# Patient Record
Sex: Male | Born: 1965 | Race: White | Hispanic: No | Marital: Married | Smoking: Former smoker
Health system: Southern US, Community
[De-identification: ages and names within clinical notes are randomized; demographics above are authoritative.]

## PROBLEM LIST (undated history)

## (undated) DIAGNOSIS — K5792 Diverticulitis of intestine, part unspecified, without perforation or abscess without bleeding: Secondary | ICD-10-CM

## (undated) DIAGNOSIS — E785 Hyperlipidemia, unspecified: Secondary | ICD-10-CM

## (undated) DIAGNOSIS — T7840XA Allergy, unspecified, initial encounter: Secondary | ICD-10-CM

## (undated) HISTORY — DX: Diverticulitis of intestine, part unspecified, without perforation or abscess without bleeding: K57.92

## (undated) HISTORY — DX: Hyperlipidemia, unspecified: E78.5

## (undated) HISTORY — DX: Allergy, unspecified, initial encounter: T78.40XA

## (undated) HISTORY — PX: VASECTOMY: SHX75

---

## 2008-09-09 ENCOUNTER — Encounter (INDEPENDENT_AMBULATORY_CARE_PROVIDER_SITE_OTHER): Payer: Self-pay | Admitting: *Deleted

## 2008-09-30 ENCOUNTER — Ambulatory Visit: Payer: Self-pay | Admitting: Family Medicine

## 2008-09-30 DIAGNOSIS — Z8719 Personal history of other diseases of the digestive system: Secondary | ICD-10-CM | POA: Insufficient documentation

## 2008-09-30 DIAGNOSIS — J309 Allergic rhinitis, unspecified: Secondary | ICD-10-CM | POA: Insufficient documentation

## 2008-09-30 DIAGNOSIS — R5383 Other fatigue: Secondary | ICD-10-CM

## 2008-09-30 DIAGNOSIS — R5381 Other malaise: Secondary | ICD-10-CM | POA: Insufficient documentation

## 2008-09-30 DIAGNOSIS — R079 Chest pain, unspecified: Secondary | ICD-10-CM | POA: Insufficient documentation

## 2008-09-30 DIAGNOSIS — E785 Hyperlipidemia, unspecified: Secondary | ICD-10-CM | POA: Insufficient documentation

## 2008-09-30 LAB — CONVERTED CEMR LAB
AST: 26 units/L (ref 0–37)
Albumin: 4.3 g/dL (ref 3.5–5.2)
Basophils Absolute: 0 10*3/uL (ref 0.0–0.1)
CO2: 27 meq/L (ref 19–32)
Eosinophils Absolute: 0.6 10*3/uL (ref 0.0–0.7)
Glucose, Bld: 105 mg/dL — ABNORMAL HIGH (ref 70–99)
HCT: 44.6 % (ref 39.0–52.0)
Hemoglobin: 15.5 g/dL (ref 13.0–17.0)
Lymphs Abs: 1.8 10*3/uL (ref 0.7–4.0)
MCHC: 34.7 g/dL (ref 30.0–36.0)
Monocytes Relative: 6.5 % (ref 3.0–12.0)
Neutro Abs: 3.4 10*3/uL (ref 1.4–7.7)
Potassium: 4.1 meq/L (ref 3.5–5.1)
RDW: 11.9 % (ref 11.5–14.6)
Sodium: 140 meq/L (ref 135–145)
TSH: 1.87 microintl units/mL (ref 0.35–5.50)
Total Protein: 7.1 g/dL (ref 6.0–8.3)

## 2008-10-14 ENCOUNTER — Ambulatory Visit: Payer: Self-pay | Admitting: Family Medicine

## 2009-02-02 ENCOUNTER — Ambulatory Visit: Payer: Self-pay | Admitting: Family Medicine

## 2009-02-02 LAB — CONVERTED CEMR LAB
Cholesterol: 226 mg/dL — ABNORMAL HIGH (ref 0–200)
Total CHOL/HDL Ratio: 4

## 2009-02-09 ENCOUNTER — Ambulatory Visit: Payer: Self-pay | Admitting: Family Medicine

## 2011-03-21 ENCOUNTER — Encounter: Payer: Self-pay | Admitting: Family Medicine

## 2011-03-22 ENCOUNTER — Encounter: Payer: Self-pay | Admitting: Family Medicine

## 2011-03-22 ENCOUNTER — Ambulatory Visit (INDEPENDENT_AMBULATORY_CARE_PROVIDER_SITE_OTHER)
Admission: RE | Admit: 2011-03-22 | Discharge: 2011-03-22 | Disposition: A | Discharge status: Home / Self Care | Payer: BC Managed Care – PPO | Source: Ambulatory Visit | Attending: Family Medicine | Admitting: Family Medicine

## 2011-03-22 ENCOUNTER — Ambulatory Visit (INDEPENDENT_AMBULATORY_CARE_PROVIDER_SITE_OTHER): Payer: BC Managed Care – PPO | Admitting: Family Medicine

## 2011-03-22 DIAGNOSIS — R29898 Other symptoms and signs involving the musculoskeletal system: Secondary | ICD-10-CM

## 2011-03-22 DIAGNOSIS — R079 Chest pain, unspecified: Secondary | ICD-10-CM

## 2011-03-22 DIAGNOSIS — M948X9 Other specified disorders of cartilage, unspecified sites: Secondary | ICD-10-CM

## 2011-03-22 NOTE — Progress Notes (Signed)
  Subjective:    Patient ID: Kenneth Webb, male    DOB: Dec 04, 1965, 45 y.o.   MRN: [id number]  HPI  Kenneth Webb, a 45 y.o. male presents today in the office for the following:    Had a physical a couple of years ago. Has had some pain in and around the xyphoid process. At night it will throb a little bit.   Will walk and bike In the last two or three months, it will go away afterwards. Will throb.   Two-year history of chest pain, predominantly in the xiphoid process region, where the patient has not had any known direct occult trauma to the area. This is been a persistent problem for him at least 2 years until now. 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 No lower abdominal muscular pain. No pain with exertion or shortness of breath.  The PMH, PSH, Social History, Family History, Medications, and allergies have been reviewed in [facility], and have been updated if relevant.   Review of Systems REVIEW OF SYSTEMS  GEN: No fevers, chills. Nontoxic. Primarily MSK c/o today. MSK: Detailed in the HPI GI: tolerating PO intake without difficulty Neuro: No numbness, parasthesias, or tingling associated. Otherwise the pertinent positives of the ROS are noted above.      Objective:   Physical Exam   Physical Exam  Blood pressure 110/74, pulse 85, temperature 98.7 F (37.1 C), temperature source Oral, height 6' (1.829 m), weight 187 lb 12.8 oz (85.186 kg), SpO2 99.00%.  GEN: WDWN, NAD, Non-toxic, A & O x 3 HEENT: Atraumatic, Normocephalic. Neck supple. No masses, No LAD. Ears and Nose: No external deformity. CV: RRR, No M/G/R. No JVD. No thrill. No extra heart sounds. PULM: CTA B, no wheezes, crackles, rhonchi. No retractions. No resp. distress. No accessory muscle use. Chest wall is notably tender to palpation, at the xiphoid process. Mildly tender to palpation at the lower sternum as well. ABD: S, NT, ND, +BS. No rebound tenderness. No HSM.  EXTR: No c/c/e NEURO Normal  gait.  PSYCH: Normally interactive. Conversant. Not depressed or anxious appearing.  Calm demeanor.        Assessment & Plan:   1. Xiphoid prominence  DG Ribs Bilateral, MR Sternum Wo Contrast, CT Chest Wo Contrast  2. CHEST PAIN  DG Ribs Bilateral, MR Sternum Wo Contrast, CT Chest Wo Contrast    Plain films are negative.  Ended up calling discussing the case with one of the radiologists at [facility], and after discussion, we decided that a plain CT of the chest without contrast would likely be the best way to image this area. It is possible that he could have a stress fracture or have a more unusual injury to this area. Given the length of time, and relatively constant pain, will obtain more advanced imaging

## 2011-03-22 NOTE — Patient Instructions (Signed)
REFERRAL: GO THE THE FRONT ROOM AT THE ENTRANCE OF OUR CLINIC, NEAR CHECK IN. ASK FOR MARION. SHE WILL HELP YOU SET UP YOUR REFERRAL. DATE: TIME:  

## 2011-03-28 ENCOUNTER — Other Ambulatory Visit: Payer: BC Managed Care – PPO

## 2011-03-29 ENCOUNTER — Ambulatory Visit (INDEPENDENT_AMBULATORY_CARE_PROVIDER_SITE_OTHER)
Admission: RE | Admit: 2011-03-29 | Discharge: 2011-03-29 | Disposition: A | Discharge status: Home / Self Care | Payer: BC Managed Care – PPO | Source: Ambulatory Visit | Attending: Family Medicine | Admitting: Family Medicine

## 2011-03-29 DIAGNOSIS — M948X9 Other specified disorders of cartilage, unspecified sites: Secondary | ICD-10-CM

## 2011-03-29 DIAGNOSIS — R29898 Other symptoms and signs involving the musculoskeletal system: Secondary | ICD-10-CM

## 2011-03-29 DIAGNOSIS — R079 Chest pain, unspecified: Secondary | ICD-10-CM

## 2011-03-31 ENCOUNTER — Other Ambulatory Visit: Payer: BC Managed Care – PPO

## 2011-04-13 ENCOUNTER — Other Ambulatory Visit: Payer: Self-pay

## 2011-04-13 ENCOUNTER — Other Ambulatory Visit: Payer: Self-pay | Admitting: Family Medicine

## 2011-04-13 DIAGNOSIS — R5381 Other malaise: Secondary | ICD-10-CM

## 2011-04-13 DIAGNOSIS — E785 Hyperlipidemia, unspecified: Secondary | ICD-10-CM

## 2011-04-13 DIAGNOSIS — Z125 Encounter for screening for malignant neoplasm of prostate: Secondary | ICD-10-CM

## 2011-04-13 DIAGNOSIS — R5383 Other fatigue: Secondary | ICD-10-CM

## 2011-04-20 ENCOUNTER — Encounter: Payer: Self-pay | Admitting: Family Medicine

## 2014-01-06 DIAGNOSIS — Z9852 Vasectomy status: Secondary | ICD-10-CM | POA: Insufficient documentation

## 2015-04-01 ENCOUNTER — Ambulatory Visit (INDEPENDENT_AMBULATORY_CARE_PROVIDER_SITE_OTHER): Payer: BLUE CROSS/BLUE SHIELD | Admitting: Family Medicine

## 2015-04-01 ENCOUNTER — Encounter: Payer: Self-pay | Admitting: Family Medicine

## 2015-04-01 VITALS — BP 106/80 | HR 68 | Temp 98.3°F | Resp 16 | Ht 70.5 in | Wt 182.8 lb

## 2015-04-01 DIAGNOSIS — M542 Cervicalgia: Secondary | ICD-10-CM

## 2015-04-01 DIAGNOSIS — R5383 Other fatigue: Secondary | ICD-10-CM

## 2015-04-01 LAB — CBC WITH DIFFERENTIAL/PLATELET
BASOS PCT: 1 % (ref 0–1)
Basophils Absolute: 0.1 10*3/uL (ref 0.0–0.1)
EOS PCT: 10 % — AB (ref 0–5)
Eosinophils Absolute: 0.5 10*3/uL (ref 0.0–0.7)
HEMATOCRIT: 44 % (ref 39.0–52.0)
HEMOGLOBIN: 15.4 g/dL (ref 13.0–17.0)
Lymphocytes Relative: 35 % (ref 12–46)
Lymphs Abs: 1.8 10*3/uL (ref 0.7–4.0)
MCH: 31.5 pg (ref 26.0–34.0)
MCHC: 35 g/dL (ref 30.0–36.0)
MCV: 90 fL (ref 78.0–100.0)
MONO ABS: 0.4 10*3/uL (ref 0.1–1.0)
MONOS PCT: 8 % (ref 3–12)
MPV: 9.1 fL (ref 8.6–12.4)
NEUTROS ABS: 2.3 10*3/uL (ref 1.7–7.7)
Neutrophils Relative %: 46 % (ref 43–77)
Platelets: 229 10*3/uL (ref 150–400)
RBC: 4.89 MIL/uL (ref 4.22–5.81)
RDW: 13.3 % (ref 11.5–15.5)
WBC: 5.1 10*3/uL (ref 4.0–10.5)

## 2015-04-01 LAB — COMPREHENSIVE METABOLIC PANEL
ALBUMIN: 4.5 g/dL (ref 3.6–5.1)
ALT: 29 U/L (ref 9–46)
AST: 19 U/L (ref 10–40)
Alkaline Phosphatase: 66 U/L (ref 40–115)
BILIRUBIN TOTAL: 0.7 mg/dL (ref 0.2–1.2)
BUN: 11 mg/dL (ref 7–25)
CO2: 26 mmol/L (ref 20–31)
CREATININE: 0.86 mg/dL (ref 0.60–1.35)
Calcium: 9.4 mg/dL (ref 8.6–10.3)
Chloride: 105 mmol/L (ref 98–110)
Glucose, Bld: 90 mg/dL (ref 65–99)
Potassium: 4.5 mmol/L (ref 3.5–5.3)
SODIUM: 139 mmol/L (ref 135–146)
TOTAL PROTEIN: 6.8 g/dL (ref 6.1–8.1)

## 2015-04-01 LAB — TSH: TSH: 1.797 u[IU]/mL (ref 0.350–4.500)

## 2015-04-01 NOTE — Progress Notes (Signed)
   Subjective:    Patient ID: Kenneth Webb, male    DOB: Nov 25, 1965, 49 y.o.   MRN: [id number]  HPI The patient presents today with complaint of anterior left neck pain for more than a month. Pain has gradually gotten worse. Gets worse throughout the day and keeps him awake at night. Does not have pain all the time, but has pain daily. Has pain when he swallows which is relieved when he is finished swallowing. Pain with coughing and sneezing. Took Alleve x 1 yesterday and had some relief. Does not recall any injury to his neck.   Past Medical History  Diagnosis Date  . Allergy   . Hyperlipidemia   . Diverticulitis   No past surgical history on file. Family History  Problem Relation Age of Onset  . Cancer Mother     breast cancer  . Mental illness Mother   . Diabetes Mother   . COPD Father    Social History  Substance Use Topics  . Smoking status: Former Games developer  . Smokeless tobacco: None  . Alcohol Use: Yes    Review of Systems No fever or chills, rare acid reflux, no ear pain, no nasal drainage, no chest pain, no cough, some fatigue related to poor sleep    Objective:   Physical Exam Physical Exam  Constitutional: Oriented to person, place, and time. He appears well-developed and well-nourished.  HENT:  Head: Normocephalic and atraumatic.  Eyes: Conjunctivae are normal.  Ears: Canals and TMs normal Throat: clear,  No erythema or exudate Neck: Normal range of motion. Neck supple. Unable to palpate any enlarged lymph nodes, thyroid normal, not able to reproduce pain.  Cardiovascular: Normal rate, regular rhythm and normal heart sounds.   Pulmonary/Chest: Effort normal and breath sounds normal.  Musculoskeletal: Normal range of motion.  Neurological: Alert and oriented to person, place, and time.  Skin: Skin is warm and dry.  Psychiatric: Normal mood and affect. Behavior is normal. Judgment and thought content normal.  Vitals reviewed.  BP 106/80 mmHg  Pulse 68   Temp(Src) 98.3 F (36.8 C) (Oral)  Resp 16  Ht 5' 10.5" (1.791 m)  Wt 182 lb 12.8 oz (82.918 kg)  BMI 25.85 kg/m2 Wt Readings from Last 3 Encounters:  04/01/15 182 lb 12.8 oz (82.918 kg)  03/22/11 187 lb 12.8 oz (85.186 kg)  02/09/09 181 lb 6.4 oz (82.283 kg)      Assessment & Plan:  Discussed with Dr. Patsy Lager 1. Anterior neck pain - Given duration of symptoms, will obtain CT - Comprehensive metabolic panel - TSH - CBC with Differential/Platelet - CT Soft Tissue Neck W Contrast; Future  2. Other fatigue - Comprehensive metabolic panel - TSH - CBC with Differential/Platelet  - He has an appointment to establish care with Dr. Katrinka Blazing 11/16  Olean Ree, FNP-BC  [facility], [facility]  04/01/2015 1:41 PM

## 2015-04-01 NOTE — Patient Instructions (Signed)
We will call you with CT scan information date/time.

## 2015-04-07 ENCOUNTER — Other Ambulatory Visit: Payer: Self-pay

## 2015-04-08 ENCOUNTER — Encounter: Payer: Self-pay | Admitting: Internal Medicine

## 2015-04-10 ENCOUNTER — Telehealth: Payer: Self-pay | Admitting: Family Medicine

## 2015-04-10 NOTE — Telephone Encounter (Signed)
Noticed that patient's CT of the neck had been cancelled. Called him to see why it was cancelled. Left him a message to let me know if he needs the CT rescheduled or if he is continuing to have symptoms.

## 2015-04-11 NOTE — Telephone Encounter (Signed)
LMOM about labs and to CB about status of CT

## 2015-04-15 NOTE — Telephone Encounter (Signed)
Spoke with pt, he states he did not want to do the test before he knew his lab results so he cancelled it. He states he is doing much better and feels no need to have CT done at this time.

## 2015-05-15 ENCOUNTER — Ambulatory Visit (INDEPENDENT_AMBULATORY_CARE_PROVIDER_SITE_OTHER): Payer: BLUE CROSS/BLUE SHIELD | Admitting: Family Medicine

## 2015-05-15 ENCOUNTER — Encounter: Payer: Self-pay | Admitting: Family Medicine

## 2015-05-15 VITALS — BP 120/78 | HR 56 | Temp 97.9°F | Resp 16 | Ht 70.5 in | Wt 182.0 lb

## 2015-05-15 DIAGNOSIS — Z131 Encounter for screening for diabetes mellitus: Secondary | ICD-10-CM | POA: Diagnosis not present

## 2015-05-15 DIAGNOSIS — Z1329 Encounter for screening for other suspected endocrine disorder: Secondary | ICD-10-CM

## 2015-05-15 DIAGNOSIS — E7800 Pure hypercholesterolemia, unspecified: Secondary | ICD-10-CM

## 2015-05-15 DIAGNOSIS — Z0000 Encounter for general adult medical examination without abnormal findings: Secondary | ICD-10-CM | POA: Diagnosis not present

## 2015-05-15 LAB — POCT URINALYSIS DIP (MANUAL ENTRY)
BILIRUBIN UA: NEGATIVE
BILIRUBIN UA: NEGATIVE
GLUCOSE UA: NEGATIVE
Leukocytes, UA: NEGATIVE
Nitrite, UA: NEGATIVE
Protein Ur, POC: NEGATIVE
SPEC GRAV UA: 1.015
UROBILINOGEN UA: 0.2
pH, UA: 5.5

## 2015-05-15 NOTE — Progress Notes (Signed)
Subjective:    Patient ID: [name], male    DOB: [birth year], 49 y.o.   MRN: [id number]  05/15/2015  Establish Care   HPI This 49 y.o. male presents for Complete Physical Examination and to establish care.  Last physical:  2010 Colonoscopy: never TDAP:  2010 Influenza: refused Eye exam:  03/2015; no g/c.  Readers. Dental exam:  Every six months. [facility] in [place].  Hypercholesterolemia: has lost weight; yoga six days per week; still eats poorly.   Nocturia x 1; no ED; no decreased sex drive.   Review of Systems  Constitutional: Negative for fever, chills, diaphoresis, activity change, appetite change, fatigue and unexpected weight change.  HENT: Negative for congestion, dental problem, drooling, ear discharge, ear pain, facial swelling, hearing loss, mouth sores, nosebleeds, postnasal drip, rhinorrhea, sinus pressure, sneezing, sore throat, tinnitus, trouble swallowing and voice change.   Eyes: Negative for photophobia, pain, discharge, redness, itching and visual disturbance.  Respiratory: Negative for apnea, cough, choking, chest tightness, shortness of breath, wheezing and stridor.   Cardiovascular: Negative for chest pain, palpitations and leg swelling.  Gastrointestinal: Negative for nausea, vomiting, abdominal pain, diarrhea, constipation and blood in stool.  Endocrine: Negative for cold intolerance, heat intolerance, polydipsia, polyphagia and polyuria.  Genitourinary: Negative for dysuria, urgency, frequency, hematuria, flank pain, decreased urine volume, discharge, penile swelling, scrotal swelling, enuresis, difficulty urinating, genital sores, penile pain and testicular pain.  Musculoskeletal: Negative for myalgias, back pain, joint swelling, arthralgias, gait problem, neck pain and neck stiffness.  Skin: Negative for color change, pallor, rash and wound.  Allergic/Immunologic: Negative for environmental allergies, food allergies and immunocompromised state.    Neurological: Negative for dizziness, tremors, seizures, syncope, facial asymmetry, speech difficulty, weakness, light-headedness, numbness and headaches.  Hematological: Negative for adenopathy. Does not bruise/bleed easily.  Psychiatric/Behavioral: Negative for suicidal ideas, hallucinations, behavioral problems, confusion, sleep disturbance, self-injury, dysphoric mood, decreased concentration and agitation. The patient is not nervous/anxious and is not hyperactive.     Past Medical History  Diagnosis Date  . Hyperlipidemia   . Diverticulitis   . Allergy    Past Surgical History  Procedure Laterality Date  . Vasectomy     No Known Allergies No current outpatient prescriptions on file.   No current facility-administered medications for this visit.   Social History   Social History  . Marital Status: Married    Spouse Name: N/A  . Number of Children: N/A  . Years of Education: N/A   Occupational History  . teacher     Social History Main Topics  . Smoking status: Former Games developer  . Smokeless tobacco: Not on file  . Alcohol Use: Yes  . Drug Use: No  . Sexual Activity: Yes   Other Topics Concern  . Not on file   Social History Narrative   Marital status: married x 21 years.      Children: none      Lives: with wife      Employment: musician; works with [employer]      Tobacco: none      Alcohol:  2 beers per day; 6-8 beers per week.      Drugs:  None      Exercise:  Yoga six days per week.      Seatbelt:  yes   Family History  Problem Relation Age of Onset  . Cancer Mother     breast cancer  . Mental illness Mother     bipolar disorder  . Diabetes Mother   .  COPD Father   . Hyperlipidemia Father   . Hypertension Paternal Grandmother        Objective:    BP 120/78 mmHg  Pulse 56  Temp(Src) 97.9 F (36.6 C)  Resp 16  Ht 5' 10.5" (1.791 m)  Wt 182 lb (82.555 kg)  BMI 25.74 kg/m2 Physical Exam  Constitutional: He is oriented to person,  place, and time. He appears well-developed and well-nourished. No distress.  HENT:  Head: Normocephalic and atraumatic.  Right Ear: External ear normal.  Left Ear: External ear normal.  Nose: Nose normal.  Mouth/Throat: Oropharynx is clear and moist.  Eyes: Conjunctivae and EOM are normal. Pupils are equal, round, and reactive to light.  Neck: Normal range of motion. Neck supple. Carotid bruit is not present. No thyromegaly present.  Cardiovascular: Normal rate, regular rhythm, normal heart sounds and intact distal pulses.  Exam reveals no gallop and no friction rub.   No murmur heard. Pulmonary/Chest: Effort normal and breath sounds normal. He has no wheezes. He has no rales.  Abdominal: Soft. Bowel sounds are normal. He exhibits no distension and no mass. There is no tenderness. There is no rebound and no guarding. Hernia confirmed negative in the right inguinal area and confirmed negative in the left inguinal area.  Genitourinary: Testes normal and penis normal.  Musculoskeletal:       Right shoulder: Normal.       Left shoulder: Normal.       Cervical back: Normal.  Lymphadenopathy:    He has no cervical adenopathy.       Right: No inguinal adenopathy present.       Left: No inguinal adenopathy present.  Neurological: He is alert and oriented to person, place, and time. He has normal reflexes. No cranial nerve deficit. He exhibits normal muscle tone. Coordination normal.  Skin: Skin is warm and dry. No rash noted. He is not diaphoretic.  Psychiatric: He has a normal mood and affect. His behavior is normal. Judgment and thought content normal.        Assessment & Plan:   1. Routine physical examination   2. Screening for diabetes mellitus   3. Screening for thyroid disorder   4. Pure hypercholesterolemia    -Anticipatory guidance provided --- exercise, weight loss, safe driving practices. -Refused flu vaccine.   Orders Placed This Encounter  Procedures  . CBC with  Differential/Platelet  . Comprehensive metabolic panel    Order Specific Question:  Has the patient fasted?    Answer:  Yes  . Hemoglobin A1c  . Lipid panel    Order Specific Question:  Has the patient fasted?    Answer:  Yes  . TSH  . POCT urinalysis dipstick   No orders of the defined types were placed in this encounter.    No Follow-up on file.    Lemond Griffee Paulita FujitaMartin Alexiz Sustaita, M.D. [facility]  [place] [address] [place], [place]  [id number] [phone] phone [phone]) [phone] fax

## 2015-05-15 NOTE — Patient Instructions (Signed)

## 2015-05-18 LAB — COMPREHENSIVE METABOLIC PANEL
ALBUMIN: 4.4 g/dL (ref 3.6–5.1)
ALK PHOS: 67 U/L (ref 40–115)
ALT: 34 U/L (ref 9–46)
AST: 19 U/L (ref 10–40)
BUN: 13 mg/dL (ref 7–25)
CHLORIDE: 104 mmol/L (ref 98–110)
CO2: 27 mmol/L (ref 20–31)
CREATININE: 0.93 mg/dL (ref 0.60–1.35)
Calcium: 9.3 mg/dL (ref 8.6–10.3)
Glucose, Bld: 99 mg/dL (ref 65–99)
Potassium: 4.5 mmol/L (ref 3.5–5.3)
SODIUM: 138 mmol/L (ref 135–146)
TOTAL PROTEIN: 7.1 g/dL (ref 6.1–8.1)
Total Bilirubin: 1 mg/dL (ref 0.2–1.2)

## 2015-05-18 LAB — CBC WITH DIFFERENTIAL/PLATELET
Basophils Absolute: 0.1 10*3/uL (ref 0.0–0.1)
Basophils Relative: 1 % (ref 0–1)
Eosinophils Absolute: 0.5 10*3/uL (ref 0.0–0.7)
Eosinophils Relative: 8 % — ABNORMAL HIGH (ref 0–5)
HCT: 46.5 % (ref 39.0–52.0)
HEMOGLOBIN: 16.2 g/dL (ref 13.0–17.0)
Lymphocytes Relative: 33 % (ref 12–46)
Lymphs Abs: 1.9 10*3/uL (ref 0.7–4.0)
MCH: 31.3 pg (ref 26.0–34.0)
MCHC: 34.8 g/dL (ref 30.0–36.0)
MCV: 89.9 fL (ref 78.0–100.0)
MONO ABS: 0.6 10*3/uL (ref 0.1–1.0)
MONOS PCT: 10 % (ref 3–12)
MPV: 10 fL (ref 8.6–12.4)
NEUTROS PCT: 48 % (ref 43–77)
Neutro Abs: 2.8 10*3/uL (ref 1.7–7.7)
Platelets: 246 10*3/uL (ref 150–400)
RBC: 5.17 MIL/uL (ref 4.22–5.81)
RDW: 12.5 % (ref 11.5–15.5)
WBC: 5.8 10*3/uL (ref 4.0–10.5)

## 2015-05-18 LAB — LIPID PANEL
Cholesterol: 222 mg/dL — ABNORMAL HIGH (ref 125–200)
HDL: 64 mg/dL (ref >=40)
LDL Cholesterol: 131 mg/dL — ABNORMAL HIGH (ref <130)
Total CHOL/HDL Ratio: 3.5 Ratio (ref <=5.0)
Triglycerides: 137 mg/dL (ref <150)
VLDL: 27 mg/dL (ref <30)

## 2015-05-18 LAB — TSH: TSH: 1.594 u[IU]/mL (ref 0.350–4.500)

## 2015-05-18 LAB — HEMOGLOBIN A1C
HEMOGLOBIN A1C: 5.5 % (ref <5.7)
MEAN PLASMA GLUCOSE: 111 mg/dL (ref <117)

## 2015-05-20 ENCOUNTER — Encounter: Payer: Self-pay | Admitting: Family Medicine

## 2016-05-25 ENCOUNTER — Encounter: Payer: BLUE CROSS/BLUE SHIELD | Admitting: Family Medicine

## 2016-06-08 ENCOUNTER — Ambulatory Visit (INDEPENDENT_AMBULATORY_CARE_PROVIDER_SITE_OTHER): Payer: BLUE CROSS/BLUE SHIELD

## 2016-06-08 ENCOUNTER — Encounter: Payer: Self-pay | Admitting: Family Medicine

## 2016-06-08 ENCOUNTER — Ambulatory Visit (INDEPENDENT_AMBULATORY_CARE_PROVIDER_SITE_OTHER): Payer: BLUE CROSS/BLUE SHIELD | Admitting: Family Medicine

## 2016-06-08 VITALS — BP 110/70 | HR 60 | Temp 98.2°F | Resp 16 | Ht 70.5 in | Wt 177.8 lb

## 2016-06-08 DIAGNOSIS — Z125 Encounter for screening for malignant neoplasm of prostate: Secondary | ICD-10-CM

## 2016-06-08 DIAGNOSIS — M545 Low back pain, unspecified: Secondary | ICD-10-CM

## 2016-06-08 DIAGNOSIS — Z131 Encounter for screening for diabetes mellitus: Secondary | ICD-10-CM | POA: Diagnosis not present

## 2016-06-08 DIAGNOSIS — Z1329 Encounter for screening for other suspected endocrine disorder: Secondary | ICD-10-CM

## 2016-06-08 DIAGNOSIS — Z9852 Vasectomy status: Secondary | ICD-10-CM

## 2016-06-08 DIAGNOSIS — Z0000 Encounter for general adult medical examination without abnormal findings: Secondary | ICD-10-CM | POA: Diagnosis not present

## 2016-06-08 DIAGNOSIS — Z114 Encounter for screening for human immunodeficiency virus [HIV]: Secondary | ICD-10-CM

## 2016-06-08 DIAGNOSIS — E7800 Pure hypercholesterolemia, unspecified: Secondary | ICD-10-CM

## 2016-06-08 DIAGNOSIS — R29898 Other symptoms and signs involving the musculoskeletal system: Secondary | ICD-10-CM

## 2016-06-08 DIAGNOSIS — M5136 Other intervertebral disc degeneration, lumbar region: Secondary | ICD-10-CM | POA: Diagnosis not present

## 2016-06-08 LAB — LIPID PANEL
CHOLESTEROL: 226 mg/dL — AB (ref <200)
HDL: 62 mg/dL (ref >40)
LDL Cholesterol: 133 mg/dL — ABNORMAL HIGH (ref <100)
Total CHOL/HDL Ratio: 3.6 Ratio (ref <5.0)
Triglycerides: 156 mg/dL — ABNORMAL HIGH (ref <150)
VLDL: 31 mg/dL — AB (ref <30)

## 2016-06-08 LAB — POCT URINALYSIS DIP (MANUAL ENTRY)
Bilirubin, UA: NEGATIVE
Blood, UA: NEGATIVE
Glucose, UA: NEGATIVE
Ketones, POC UA: NEGATIVE
LEUKOCYTES UA: NEGATIVE
NITRITE UA: NEGATIVE
PH UA: 6
PROTEIN UA: NEGATIVE
Spec Grav, UA: 1.015
Urobilinogen, UA: 0.2

## 2016-06-08 LAB — COMPREHENSIVE METABOLIC PANEL
ALT: 37 U/L (ref 9–46)
AST: 24 U/L (ref 10–40)
Albumin: 4.6 g/dL (ref 3.6–5.1)
Alkaline Phosphatase: 74 U/L (ref 40–115)
BILIRUBIN TOTAL: 1.7 mg/dL — AB (ref 0.2–1.2)
BUN: 13 mg/dL (ref 7–25)
CHLORIDE: 103 mmol/L (ref 98–110)
CO2: 28 mmol/L (ref 20–31)
CREATININE: 0.88 mg/dL (ref 0.60–1.35)
Calcium: 9.5 mg/dL (ref 8.6–10.3)
Glucose, Bld: 86 mg/dL (ref 65–99)
Potassium: 4.1 mmol/L (ref 3.5–5.3)
SODIUM: 138 mmol/L (ref 135–146)
TOTAL PROTEIN: 7.1 g/dL (ref 6.1–8.1)

## 2016-06-08 LAB — CBC WITH DIFFERENTIAL/PLATELET
BASOS PCT: 0 %
Basophils Absolute: 0 cells/uL (ref 0–200)
EOS PCT: 5 %
Eosinophils Absolute: 240 cells/uL (ref 15–500)
HCT: 46.6 % (ref 38.5–50.0)
Hemoglobin: 16.3 g/dL (ref 13.2–17.1)
LYMPHS ABS: 1440 {cells}/uL (ref 850–3900)
Lymphocytes Relative: 30 %
MCH: 31.8 pg (ref 27.0–33.0)
MCHC: 35 g/dL (ref 32.0–36.0)
MCV: 91 fL (ref 80.0–100.0)
MONOS PCT: 9 %
MPV: 9.2 fL (ref 7.5–12.5)
Monocytes Absolute: 432 cells/uL (ref 200–950)
NEUTROS ABS: 2688 {cells}/uL (ref 1500–7800)
Neutrophils Relative %: 56 %
PLATELETS: 230 10*3/uL (ref 140–400)
RBC: 5.12 MIL/uL (ref 4.20–5.80)
RDW: 13.2 % (ref 11.0–15.0)
WBC: 4.8 10*3/uL (ref 3.8–10.8)

## 2016-06-08 LAB — HEMOGLOBIN A1C
Hgb A1c MFr Bld: 5.2 % (ref <5.7)
Mean Plasma Glucose: 103 mg/dL

## 2016-06-08 LAB — PSA: PSA: 1.4 ng/mL (ref <=4.0)

## 2016-06-08 LAB — TSH: TSH: 2.54 m[IU]/L (ref 0.40–4.50)

## 2016-06-08 NOTE — Progress Notes (Signed)
Subjective:    Patient ID: Kenneth ShipperKevin Chaput, male    DOB: Oct 08, 1965, 50 y.o.   MRN: [id number]  06/08/2016  Annual Exam   HPI This 50 y.o. male presents for Complete Physical Examination.  Last physical:  05/15/15 Colonoscopy: n/a Eye exam:  Yearly; +readers Dental exam:  Every six months.   REFUSES INFLUENZA VACCINES.    Visual Acuity Screening   Right eye Left eye Both eyes  Without correction: 20/25 20/20 20/20   With correction:       Immunization History  Administered Date(s) Administered  . Td 10/14/2008   BP Readings from Last 3 Encounters:  06/08/16 110/70  05/15/15 120/78  04/01/15 106/80   Wt Readings from Last 3 Encounters:  06/08/16 177 lb 12.8 oz (80.6 kg)  05/15/15 182 lb (82.6 kg)  04/01/15 182 lb 12.8 oz (82.9 kg)     Review of Systems  Constitutional: Negative for activity change, appetite change, chills, diaphoresis, fatigue, fever and unexpected weight change.  HENT: Negative for congestion, dental problem, drooling, ear discharge, ear pain, facial swelling, hearing loss, mouth sores, nosebleeds, postnasal drip, rhinorrhea, sinus pressure, sneezing, sore throat, tinnitus, trouble swallowing and voice change.   Eyes: Negative for photophobia, pain, discharge, redness, itching and visual disturbance.  Respiratory: Negative for apnea, cough, choking, chest tightness, shortness of breath, wheezing and stridor.   Cardiovascular: Negative for chest pain, palpitations and leg swelling.  Gastrointestinal: Negative for abdominal pain, blood in stool, constipation, diarrhea, nausea and vomiting.  Endocrine: Negative for cold intolerance, heat intolerance, polydipsia, polyphagia and polyuria.  Genitourinary: Negative for decreased urine volume, difficulty urinating, discharge, dysuria, enuresis, flank pain, frequency, genital sores, hematuria, penile pain, penile swelling, scrotal swelling, testicular pain and urgency.  Musculoskeletal: Negative for arthralgias,  back pain, gait problem, joint swelling, myalgias, neck pain and neck stiffness.  Skin: Negative for color change, pallor, rash and wound.  Allergic/Immunologic: Negative for environmental allergies, food allergies and immunocompromised state.  Neurological: Negative for dizziness, tremors, seizures, syncope, facial asymmetry, speech difficulty, weakness, light-headedness, numbness and headaches.  Hematological: Negative for adenopathy. Does not bruise/bleed easily.  Psychiatric/Behavioral: Negative for agitation, behavioral problems, confusion, decreased concentration, dysphoric mood, hallucinations, self-injury, sleep disturbance and suicidal ideas. The patient is not nervous/anxious and is not hyperactive.        Bedtime 10:00-12:00; wakes up 8:00am.  Insomnia has improved in the past three months.    Past Medical History:  Diagnosis Date  . Allergy   . Diverticulitis   . Hyperlipidemia    Past Surgical History:  Procedure Laterality Date  . VASECTOMY     No Known Allergies No current outpatient prescriptions on file.   No current facility-administered medications for this visit.    Social History   Social History  . Marital status: Married    Spouse name: N/A  . Number of children: N/A  . Years of education: N/A   Occupational History  . teacher     Social History Main Topics  . Smoking status: Former Games developermoker  . Smokeless tobacco: Not on file  . Alcohol use Yes  . Drug use: No  . Sexual activity: Yes   Other Topics Concern  . Not on file   Social History Narrative   Marital status: married x 22 years.      Children: none      Lives: with wife      Employment: Technical sales engineermusician; works with [employer].   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      Tobacco: quit smoking at age 46.      Alcohol:  1-3 beers per week in 2017; has cut down from 2 beers per day.      Drugs:  None      Exercise:  Yoga six days per week.      Seatbelt:  yes   Family  History  Problem Relation Age of Onset  . Cancer Mother     breast cancer  . Mental illness Mother     bipolar disorder  . Diabetes Mother   . COPD Father   . Hyperlipidemia Father   . Hypertension Paternal Grandmother        Objective:    BP 110/70   Pulse 60   Temp 98.2 F (36.8 C) (Oral)   Resp 16   Ht 5' 10.5" (1.791 m)   Wt 177 lb 12.8 oz (80.6 kg)   SpO2 99%   BMI 25.15 kg/m  Physical Exam  Constitutional: He is oriented to person, place, and time. He appears well-developed and well-nourished. No distress.  HENT:  Head: Normocephalic and atraumatic.  Right Ear: External ear normal.  Left Ear: External ear normal.  Nose: Nose normal.  Mouth/Throat: Oropharynx is clear and moist.  Eyes: Conjunctivae and EOM are normal. Pupils are equal, round, and reactive to light.  Neck: Normal range of motion. Neck supple. Carotid bruit is not present. No thyromegaly present.  Cardiovascular: Normal rate, regular rhythm, normal heart sounds and intact distal pulses.  Exam reveals no gallop and no friction rub.   No murmur heard. Pulmonary/Chest: Effort normal and breath sounds normal. He has no wheezes. He has no rales.  Abdominal: Soft. Bowel sounds are normal. He exhibits no distension and no mass. There is no tenderness. There is no rebound and no guarding. Hernia confirmed negative in the right inguinal area and confirmed negative in the left inguinal area.  Genitourinary: Penis normal.  Musculoskeletal:       Right shoulder: Normal.       Left shoulder: Normal.       Cervical back: Normal.  Lymphadenopathy:    He has no cervical adenopathy.       Right: No inguinal adenopathy present.       Left: No inguinal adenopathy present.  Neurological: He is alert and oriented to person, place, and time. He has normal reflexes. No cranial nerve deficit. He exhibits normal muscle tone. Coordination normal.  Skin: Skin is warm and dry. No rash noted. He is not diaphoretic.    Psychiatric: He has a normal mood and affect. His behavior is normal. Judgment and thought content normal.   Results for orders placed or performed in visit on 06/08/16  POCT urinalysis dipstick  Result Value Ref Range   Color, UA yellow yellow   Clarity, UA clear clear   Glucose, UA negative negative   Bilirubin, UA negative negative   Ketones, POC UA negative negative   Spec Grav, UA 1.015    Blood, UA negative negative   pH, UA 6.0    Protein Ur, POC negative negative   Urobilinogen, UA 0.2    Nitrite, UA Negative Negative   Leukocytes, UA Negative Negative   Depression screen [facility] 2/9 06/08/2016 05/15/2015 04/01/2015  Decreased Interest 0 0 0  Down, Depressed, Hopeless 0 0 0  PHQ - 2 Score 0 0 0       Assessment & Plan:   1. Routine physical examination  2. Screening for HIV (human immunodeficiency virus)   3. Pure hypercholesterolemia   4. Xiphoid prominence   5. H/O vasectomy   6. Screening for diabetes mellitus   7. Screening for prostate cancer   8. Screening for thyroid disorder   9. Acute bilateral low back pain without sciatica    -anticipatory guidance --- exercise, weight loss, low-cholesterol food choices. -obtain age appropriate screening labs. 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  Recommend regular stretching and exercise. -chronic intermittent lower back pain; obtain lumbar spine films due to duration; recommend ongoing stretching, yoga, regular core strengthening.   Orders Placed This Encounter  Procedures  . DG Lumbar Spine Complete    Standing Status:   Future    Standing Expiration Date:   06/08/2017    Order Specific Question:   Reason for Exam (SYMPTOM  OR DIAGNOSIS REQUIRED)    Answer:   low back pain    Order Specific Question:   Preferred imaging location?    Answer:   External  . CBC with Differential/Platelet  . Comprehensive metabolic panel    Order  Specific Question:   Has the patient fasted?    Answer:   Yes  . Lipid panel    Order Specific Question:   Has the patient fasted?    Answer:   Yes  . HIV antibody  . PSA  . TSH  . Hemoglobin A1c  . POCT urinalysis dipstick   No orders of the defined types were placed in this encounter.   Return in about 1 year (around 06/08/2017) for complete physical examiniation.   Zyliah Schier Paulita FujitaMartin Humberto Addo, M.D. [facility]  [place] [address] [place], [place]  [id number] [phone] phone [phone]) [phone] fax

## 2016-06-08 NOTE — Patient Instructions (Addendum)
IF you received an x-ray today, you will receive an invoice from [facility]. Please contact [facility] at [phone] with questions or concerns regarding your invoice.   IF you received labwork today, you will receive an invoice from [facility]. Please contact [facility] at [phone] with questions or concerns regarding your invoice.   Our billing staff will not be able to assist you with questions regarding bills from these companies.  You will be contacted with the lab results as soon as they are available. The fastest way to get your results is to activate your My Chart account. Instructions are located on the last page of this paperwork. If you have not heard from us regarding the results in 2 weeks, please contact this office.    cKeeping you healthy  Get these tests  Blood pressure- Have your blood pressure checked once a year by your healthcare provider.  Normal blood pressure is 120/80.  Weight- Have your body mass index (BMI) calculated to screen for obesity.  BMI is a measure of body fat based on height and weight. You can also calculate your own BMI at https://www.west-esparza.com/www.nhlbisupport.com/bmi/.  Cholesterol- Have your cholesterol checked regularly starting at age 48, sooner may be necessary if you have diabetes, high blood pressure, if a family member developed heart diseases at an early age or if you smoke.   Chlamydia, HIV, and other sexual transmitted disease- Get screened each year until the age of 48 then within three months of each new sexual partner.  Diabetes- Have your blood sugar checked regularly if you have high blood pressure, high cholesterol, a family history of diabetes or if you are overweight.  Get these vaccines  Flu shot- Every fall.  Tetanus shot- Every 10 years.  Menactra- Single dose; prevents meningitis.  Take these steps  Don't smoke- If you do smoke, ask your healthcare provider about quitting. For tips on  how to quit, go to www.smokefree.gov or call 1-800-QUIT-NOW.  Be physically active- Exercise 5 days a week for at least 30 minutes.  If you are not already physically active start slow and gradually work up to 30 minutes of moderate physical activity.  Examples of moderate activity include walking briskly, mowing the yard, dancing, swimming bicycling, etc.  Eat a healthy diet- Eat a variety of healthy foods such as fruits, vegetables, low fat milk, low fat cheese, yogurt, lean meats, poultry, fish, beans, tofu, etc.  For more information on healthy eating, go to www.thenutritionsource.org  Drink alcohol in moderation- Limit alcohol intake two drinks or less a day.  Never drink and drive.  Dentist- Brush and floss teeth twice daily; visit your dentis twice a year.  Depression-Your emotional health is as important as your physical health.  If you're feeling down, losing interest in things you normally enjoy please talk with your healthcare provider.  Gun Safety- If you keep a gun in your home, keep it unloaded and with the safety lock on.  Bullets should be stored separately.  Helmet use- Always wear a helmet when riding a motorcycle, bicycle, rollerblading or skateboarding.  Safe sex- If you may be exposed to a sexually transmitted infection, use a condom  Seat belts- Seat bels can save your life; always wear one.  Smoke/Carbon Monoxide detectors- These detectors need to be installed on the appropriate level of your home.  Replace batteries at least once a year.  Skin Cancer- When out in the sun, cover up and use sunscreen SPF  15 or higher. Violence- If anyone is threatening or hurting you, please tell your healthcare provider.

## 2016-06-09 LAB — HIV ANTIBODY (ROUTINE TESTING W REFLEX): HIV 1&2 Ab, 4th Generation: NONREACTIVE

## 2016-06-29 DIAGNOSIS — H52223 Regular astigmatism, bilateral: Secondary | ICD-10-CM | POA: Diagnosis not present

## 2016-06-29 DIAGNOSIS — H524 Presbyopia: Secondary | ICD-10-CM | POA: Diagnosis not present

## 2016-06-29 DIAGNOSIS — H5213 Myopia, bilateral: Secondary | ICD-10-CM | POA: Diagnosis not present

## 2016-11-01 DIAGNOSIS — L821 Other seborrheic keratosis: Secondary | ICD-10-CM | POA: Diagnosis not present

## 2016-11-01 DIAGNOSIS — L814 Other melanin hyperpigmentation: Secondary | ICD-10-CM | POA: Diagnosis not present

## 2016-11-01 DIAGNOSIS — D1800 Hemangioma unspecified site: Secondary | ICD-10-CM | POA: Diagnosis not present

## 2016-11-01 DIAGNOSIS — D225 Melanocytic nevi of trunk: Secondary | ICD-10-CM | POA: Diagnosis not present

## 2016-12-28 ENCOUNTER — Encounter: Payer: Self-pay | Admitting: Family Medicine

## 2017-01-02 ENCOUNTER — Other Ambulatory Visit: Payer: Self-pay | Admitting: Family Medicine

## 2017-01-02 MED ORDER — DIAZEPAM 2 MG PO TABS: 2.0000 mg | ORAL_TABLET | Freq: Every day | ORAL | 0 refills | Status: DC

## 2017-01-02 NOTE — Telephone Encounter (Signed)
Pt called to see if RX for valium is here I tried to get someone in clinical three times no answer I also asked the clerical team to check they didn't see a Rx in the box for patient please call pt when you find it to pick up

## 2017-01-02 NOTE — Progress Notes (Unsigned)
Please call in Valium as approved. 

## 2017-01-02 NOTE — Telephone Encounter (Signed)
Left with patient to call clinic of which pharmacy to send medication  Pharmacy listed do not have medication or patient on file

## 2017-01-02 NOTE — Progress Notes (Signed)
Left message to return message with correct pharmacy to use

## 2017-01-04 NOTE — Telephone Encounter (Signed)
Called and left message on AM to inform patient he has Rx to pick up from the pharmacy.

## 2017-06-13 ENCOUNTER — Encounter: Payer: BLUE CROSS/BLUE SHIELD | Admitting: Family Medicine

## 2017-07-17 ENCOUNTER — Encounter: Payer: Self-pay | Admitting: Family Medicine

## 2017-07-17 ENCOUNTER — Ambulatory Visit (INDEPENDENT_AMBULATORY_CARE_PROVIDER_SITE_OTHER): Payer: BLUE CROSS/BLUE SHIELD | Admitting: Family Medicine

## 2017-07-17 ENCOUNTER — Other Ambulatory Visit: Payer: Self-pay

## 2017-07-17 DIAGNOSIS — F40243 Fear of flying: Secondary | ICD-10-CM | POA: Diagnosis not present

## 2017-07-17 DIAGNOSIS — Z13220 Encounter for screening for lipoid disorders: Secondary | ICD-10-CM | POA: Diagnosis not present

## 2017-07-17 DIAGNOSIS — Z125 Encounter for screening for malignant neoplasm of prostate: Secondary | ICD-10-CM

## 2017-07-17 DIAGNOSIS — Z0000 Encounter for general adult medical examination without abnormal findings: Secondary | ICD-10-CM

## 2017-07-17 DIAGNOSIS — Z131 Encounter for screening for diabetes mellitus: Secondary | ICD-10-CM

## 2017-07-17 DIAGNOSIS — Z1329 Encounter for screening for other suspected endocrine disorder: Secondary | ICD-10-CM

## 2017-07-17 LAB — POCT URINALYSIS DIP (MANUAL ENTRY)
Blood, UA: NEGATIVE
GLUCOSE UA: NEGATIVE mg/dL
LEUKOCYTES UA: NEGATIVE
NITRITE UA: NEGATIVE
Protein Ur, POC: NEGATIVE mg/dL
SPEC GRAV UA: 1.025 (ref 1.010–1.025)
UROBILINOGEN UA: 0.2 U/dL
pH, UA: 5.5 (ref 5.0–8.0)

## 2017-07-17 NOTE — Patient Instructions (Addendum)
   IF you received an x-ray today, you will receive an invoice from [facility]. Please contact  Radiology at [phone] with questions or concerns regarding your invoice.   IF you received labwork today, you will receive an invoice from [facility]. Please contact [facility] at [phone] with questions or concerns regarding your invoice.   Our billing staff will not be able to assist you with questions regarding bills from these companies.  You will be contacted with the lab results as soon as they are available. The fastest way to get your results is to activate your My Chart account. Instructions are located on the last page of this paperwork. If you have not heard from us regarding the results in 2 weeks, please contact this office.      Preventive Care 40-64 Years, Male Preventive care refers to lifestyle choices and visits with your health care provider that can promote health and wellness. What does preventive care include?  A yearly physical exam. This is also called an annual well check.  Dental exams once or twice a year.  Routine eye exams. Ask your health care provider how often you should have your eyes checked.  Personal lifestyle choices, including: ? Daily care of your teeth and gums. ? Regular physical activity. ? Eating a healthy diet. ? Avoiding tobacco and drug use. ? Limiting alcohol use. ? Practicing safe sex. ? Taking low-dose aspirin every day starting at age 50. What happens during an annual well check? The services and screenings done by your health care provider during your annual well check will depend on your age, overall health, lifestyle risk factors, and family history of disease. Counseling Your health care provider may ask you questions about your:  Alcohol use.  Tobacco use.  Drug use.  Emotional well-being.  Home and relationship well-being.  Sexual activity.  Eating habits.  Work and work  environment.  Screening You may have the following tests or measurements:  Height, weight, and BMI.  Blood pressure.  Lipid and cholesterol levels. These may be checked every 5 years, or more frequently if you are over 50 years old.  Skin check.  Lung cancer screening. You may have this screening every year starting at age 55 if you have a 30-pack-year history of smoking and currently smoke or have quit within the past 15 years.  Fecal occult blood test (FOBT) of the stool. You may have this test every year starting at age 50.  Flexible sigmoidoscopy or colonoscopy. You may have a sigmoidoscopy every 5 years or a colonoscopy every 10 years starting at age 50.  Prostate cancer screening. Recommendations will vary depending on your family history and other risks.  Hepatitis C blood test.  Hepatitis B blood test.  Sexually transmitted disease (STD) testing.  Diabetes screening. This is done by checking your blood sugar (glucose) after you have not eaten for a while (fasting). You may have this done every 1-3 years.  Discuss your test results, treatment options, and if necessary, the need for more tests with your health care provider. Vaccines Your health care provider may recommend certain vaccines, such as:  Influenza vaccine. This is recommended every year.  Tetanus, diphtheria, and acellular pertussis (Tdap, Td) vaccine. You may need a Td booster every 10 years.  Varicella vaccine. You may need this if you have not been vaccinated.  Zoster vaccine. You may need this after age 60.  Measles, mumps, and rubella (MMR) vaccine. You may need at least one dose   of MMR if you were born in 1957 or later. You may also need a second dose.  Pneumococcal 13-valent conjugate (PCV13) vaccine. You may need this if you have certain conditions and have not been vaccinated.  Pneumococcal polysaccharide (PPSV23) vaccine. You may need one or two doses if you smoke cigarettes or if you have  certain conditions.  Meningococcal vaccine. You may need this if you have certain conditions.  Hepatitis A vaccine. You may need this if you have certain conditions or if you travel or work in places where you may be exposed to hepatitis A.  Hepatitis B vaccine. You may need this if you have certain conditions or if you travel or work in places where you may be exposed to hepatitis B.  Haemophilus influenzae type b (Hib) vaccine. You may need this if you have certain risk factors.  Talk to your health care provider about which screenings and vaccines you need and how often you need them. This information is not intended to replace advice given to you by your health care provider. Make sure you discuss any questions you have with your health care provider. Document Released: 07/24/2015 Document Revised: 03/16/2016 Document Reviewed: 04/28/2015 Elsevier Interactive Patient Education  [employer].

## 2017-07-17 NOTE — Progress Notes (Signed)
Subjective:    Patient ID: Kenneth Webb, male    DOB: [birth year], 52 y.o.   MRN: [id number]  07/17/2017  Annual Exam    HPI This 52 y.o. male presents for Complete Physical Examination.  Last physical:  06-08-2016 PSA:  2017   Gave up alcohol on 07/10/2017; gave up meat as well.   Visual Acuity Screening   Right eye Left eye Both eyes  Without correction:     With correction: 20/20 20/20 20/20     BP Readings from Last 3 Encounters:  06/08/16 110/70  05/15/15 120/78  04/01/15 106/80   Wt Readings from Last 3 Encounters:  06/08/16 177 lb 12.8 oz (80.6 kg)  05/15/15 182 lb (82.6 kg)  04/01/15 182 lb 12.8 oz (82.9 kg)   Immunization History  Administered Date(s) Administered  . Td 10/14/2008   Health Maintenance  Topic Date Due  . INFLUENZA VACCINE  03/19/2018 (Originally 02/08/2017)  . TETANUS/TDAP  10/15/2018  . HIV Screening  Completed    Review of Systems  Constitutional: Negative for activity change, appetite change, chills, diaphoresis, fatigue, fever and unexpected weight change.  HENT: Negative for congestion, dental problem, drooling, ear discharge, ear pain, facial swelling, hearing loss, mouth sores, nosebleeds, postnasal drip, rhinorrhea, sinus pressure, sneezing, sore throat, tinnitus, trouble swallowing and voice change.   Eyes: Negative for photophobia, pain, discharge, redness, itching and visual disturbance.  Respiratory: Negative for apnea, cough, choking, chest tightness, shortness of breath, wheezing and stridor.   Cardiovascular: Negative for chest pain, palpitations and leg swelling.  Gastrointestinal: Negative for abdominal pain, blood in stool, constipation, diarrhea, nausea and vomiting.  Endocrine: Negative for cold intolerance, heat intolerance, polydipsia, polyphagia and polyuria.  Genitourinary: Negative for decreased urine volume, difficulty urinating, discharge, dysuria, enuresis, flank pain, frequency, genital sores, hematuria, penile  pain, penile swelling, scrotal swelling, testicular pain and urgency.       Nocturia x 0-1.  Urinary stream strong.  No ED.  Sex drive is normal.  Musculoskeletal: Negative for arthralgias, back pain, gait problem, joint swelling, myalgias, neck pain and neck stiffness.  Skin: Negative for color change, pallor, rash and wound.  Allergic/Immunologic: Negative for environmental allergies, food allergies and immunocompromised state.  Neurological: Negative for dizziness, tremors, seizures, syncope, facial asymmetry, speech difficulty, weakness, light-headedness, numbness and headaches.  Hematological: Negative for adenopathy. Does not bruise/bleed easily.  Psychiatric/Behavioral: Negative for agitation, behavioral problems, confusion, decreased concentration, dysphoric mood, hallucinations, self-injury, sleep disturbance and suicidal ideas. The patient is not nervous/anxious and is not hyperactive.        Bedtime 10-12; wakes up 800.    Past Medical History:  Diagnosis Date  . Allergy   . Diverticulitis   . Hyperlipidemia    Past Surgical History:  Procedure Laterality Date  . VASECTOMY     No Known Allergies Current Outpatient Medications on File Prior to Visit  Medication Sig Dispense Refill  . diazepam (VALIUM) 2 MG tablet Take 1-3 tablets (2-6 mg total) by mouth daily. 6 tablet 0   No current facility-administered medications on file prior to visit.    Social History   Socioeconomic History  . Marital status: Married    Spouse name: Not on file  . Number of children: Not on file  . Years of education: Not on file  . Highest education level: Not on file  Social Needs  . Financial resource strain: Not on file  . Food insecurity - worry: Not on file  . Food insecurity - inability:  Not on file  . Transportation needs - medical: Not on file  . Transportation needs - non-medical: Not on file  Occupational History  . Occupation: Runner, broadcasting/film/video   Tobacco Use  . Smoking status: Former  Games developer  . Smokeless tobacco: Never Used  Substance and Sexual Activity  . Alcohol use: Yes  . Drug use: No  . Sexual activity: Yes  Other Topics Concern  . Not on file  Social History Narrative   Marital status: married x 23 years.      Children: none      Lives: with wife      Employment: Technical sales engineer; works with [employer].   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      Tobacco: quit smoking at age 55.      Alcohol:  1-3 beers per week in 2017; has cut down from 2 beers per day.; quit drinking in 07/10/2017.      Drugs:  None      Exercise:  Yoga six days per week.      Seatbelt:  yes   Family History  Problem Relation Age of Onset  . Cancer Mother        breast cancer  . Mental illness Mother        bipolar disorder  . Diabetes Mother   . COPD Father   . Hyperlipidemia Father   . Hypertension Paternal Grandmother        Objective:    There were no vitals taken for this visit. Physical Exam  Constitutional: He is oriented to person, place, and time. He appears well-developed and well-nourished. No distress.  HENT:  Head: Normocephalic and atraumatic.  Right Ear: External ear normal.  Left Ear: External ear normal.  Nose: Nose normal.  Mouth/Throat: Oropharynx is clear and moist.  Eyes: Conjunctivae and EOM are normal. Pupils are equal, round, and reactive to light.  Neck: Normal range of motion. Neck supple. Carotid bruit is not present. No thyromegaly present.  Cardiovascular: Normal rate, regular rhythm, normal heart sounds and intact distal pulses. Exam reveals no gallop and no friction rub.  No murmur heard. Pulmonary/Chest: Effort normal and breath sounds normal. He has no wheezes. He has no rales.  Abdominal: Soft. Bowel sounds are normal. He exhibits no distension and no mass. There is no tenderness. There is no rebound and no guarding. Hernia confirmed negative in the right inguinal area and confirmed  negative in the left inguinal area.  Genitourinary: Testes normal and penis normal.  Musculoskeletal:       Right shoulder: Normal.       Left shoulder: Normal.       Cervical back: Normal.  Lymphadenopathy:    He has no cervical adenopathy.       Right: No inguinal adenopathy present.       Left: No inguinal adenopathy present.  Neurological: He is alert and oriented to person, place, and time. He has normal reflexes. No cranial nerve deficit. He exhibits normal muscle tone. Coordination normal.  Skin: Skin is warm and dry. No rash noted. He is not diaphoretic.  Psychiatric: He has a normal mood and affect. His behavior is normal. Judgment and thought content normal.   No results found. Depression screen [facility] 2/9 07/17/2017 06/08/2016 05/15/2015 04/01/2015  Decreased Interest 0 0 0 0  Down, Depressed, Hopeless 0 0 0 0  PHQ - 2 Score 0 0 0 0   Fall Risk  07/17/2017 06/08/2016 05/15/2015 04/01/2015  Falls in the past year? No No No No        Assessment & Plan:   1. Routine physical examination   2. Screening for diabetes mellitus   3. Screening, lipid   4. Screening for prostate cancer   5. Screening for thyroid disorder   6. Fear of flying    -anticipatory guidance provided --- exercise, weight loss, safe driving practices. -obtain age appropriate screening labs and labs for chronic disease management. -Patient has an upcoming international flight.  I am happy to refill diazepam prior to flight.  Orders Placed This Encounter  Procedures  . CBC with Differential/Platelet  . Comprehensive metabolic panel    Order Specific Question:   Has the patient fasted?    Answer:   No  . Hemoglobin A1c  . Lipid panel    Order Specific Question:   Has the patient fasted?    Answer:   No  . PSA  . TSH  . POCT urinalysis dipstick   No orders of the defined types were placed in this encounter.   Return in about 1 year (around 07/17/2018) for complete physical examiniation.   Kristi  Paulita FujitaMartin Smith, M.D. Primary Care at [facility]  [place] previously [facility] [address] [place], [place]  [id number] [phone] phone [phone] fax

## 2017-07-18 LAB — CBC WITH DIFFERENTIAL/PLATELET
BASOS ABS: 0 10*3/uL (ref 0.0–0.2)
Basos: 0 %
EOS (ABSOLUTE): 0.2 10*3/uL (ref 0.0–0.4)
Eos: 4 %
HEMOGLOBIN: 15.2 g/dL (ref 13.0–17.7)
Hematocrit: 45 % (ref 37.5–51.0)
IMMATURE GRANS (ABS): 0 10*3/uL (ref 0.0–0.1)
Immature Granulocytes: 0 %
LYMPHS: 38 %
Lymphocytes Absolute: 1.9 10*3/uL (ref 0.7–3.1)
MCH: 31.1 pg (ref 26.6–33.0)
MCHC: 33.8 g/dL (ref 31.5–35.7)
MCV: 92 fL (ref 79–97)
MONOCYTES: 7 %
Monocytes Absolute: 0.3 10*3/uL (ref 0.1–0.9)
Neutrophils Absolute: 2.5 10*3/uL (ref 1.4–7.0)
Neutrophils: 51 %
Platelets: 252 10*3/uL (ref 150–379)
RBC: 4.88 x10E6/uL (ref 4.14–5.80)
RDW: 13.2 % (ref 12.3–15.4)
WBC: 5 10*3/uL (ref 3.4–10.8)

## 2017-07-18 LAB — HEMOGLOBIN A1C
Est. average glucose Bld gHb Est-mCnc: 108 mg/dL
HEMOGLOBIN A1C: 5.4 % (ref 4.8–5.6)

## 2017-07-18 LAB — COMPREHENSIVE METABOLIC PANEL
ALBUMIN: 4.7 g/dL (ref 3.5–5.5)
ALK PHOS: 65 IU/L (ref 39–117)
ALT: 36 IU/L (ref 0–44)
AST: 24 IU/L (ref 0–40)
Albumin/Globulin Ratio: 2 (ref 1.2–2.2)
BUN / CREAT RATIO: 14 (ref 9–20)
BUN: 12 mg/dL (ref 6–24)
Bilirubin Total: 1.2 mg/dL (ref 0.0–1.2)
CO2: 17 mmol/L — ABNORMAL LOW (ref 20–29)
Calcium: 9.2 mg/dL (ref 8.7–10.2)
Chloride: 104 mmol/L (ref 96–106)
Creatinine, Ser: 0.88 mg/dL (ref 0.76–1.27)
GFR calc Af Amer: 117 mL/min/{1.73_m2} (ref >59)
GFR calc non Af Amer: 101 mL/min/{1.73_m2} (ref >59)
GLUCOSE: 86 mg/dL (ref 65–99)
Globulin, Total: 2.3 g/dL (ref 1.5–4.5)
Potassium: 3.9 mmol/L (ref 3.5–5.2)
Sodium: 140 mmol/L (ref 134–144)
TOTAL PROTEIN: 7 g/dL (ref 6.0–8.5)

## 2017-07-18 LAB — LIPID PANEL
CHOL/HDL RATIO: 3.8 ratio (ref 0.0–5.0)
Cholesterol, Total: 208 mg/dL — ABNORMAL HIGH (ref 100–199)
HDL: 55 mg/dL (ref >39)
LDL Calculated: 136 mg/dL — ABNORMAL HIGH (ref 0–99)
TRIGLYCERIDES: 84 mg/dL (ref 0–149)
VLDL Cholesterol Cal: 17 mg/dL (ref 5–40)

## 2017-07-18 LAB — PSA: Prostate Specific Ag, Serum: 1.4 ng/mL (ref 0.0–4.0)

## 2017-07-18 LAB — TSH: TSH: 2.9 u[IU]/mL (ref 0.450–4.500)

## 2017-07-26 DIAGNOSIS — H52223 Regular astigmatism, bilateral: Secondary | ICD-10-CM | POA: Diagnosis not present

## 2017-07-26 DIAGNOSIS — H524 Presbyopia: Secondary | ICD-10-CM | POA: Diagnosis not present

## 2017-09-12 ENCOUNTER — Encounter: Payer: Self-pay | Admitting: Physician Assistant

## 2017-09-12 ENCOUNTER — Other Ambulatory Visit: Payer: Self-pay

## 2017-09-12 ENCOUNTER — Ambulatory Visit: Payer: BLUE CROSS/BLUE SHIELD | Admitting: Physician Assistant

## 2017-09-12 VITALS — BP 120/78 | HR 65 | Temp 98.1°F | Resp 18 | Ht 70.5 in | Wt 175.2 lb

## 2017-09-12 DIAGNOSIS — H6122 Impacted cerumen, left ear: Secondary | ICD-10-CM

## 2017-09-12 NOTE — Progress Notes (Signed)
Kenneth ShipperKevin Estell  MRN: [id number] DOB: 27-Dec-1965  PCP: Ethelda ChickSmith, Kristi M, MD  Chief Complaint  Patient presents with  . Ear Pain    cant hear out of left ear     Subjective:  Pt presents to clinic for likely blockage of his left ear.  He was told at his last visit that his ear had wax in it but last night he became a problem.  He is having ringing in his left ear and decreased hearing.  He is having no pain.  History is obtained by patient.  Review of Systems  HENT: Positive for hearing loss. Negative for ear discharge and ear pain.     Patient Active Problem List   Diagnosis Date Noted  . H/O vasectomy 01/06/2014  . 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 HYPERLIPIDEMIA 09/30/2008  . ALLERGIC RHINITIS 09/30/2008  . FATIGUE 09/30/2008  . CHEST PAIN 09/30/2008  . DIVERTICULITIS, HX OF 09/30/2008    Current Outpatient Medications on File Prior to Visit  Medication Sig Dispense Refill  . diazepam (VALIUM) 2 MG tablet Take 1-3 tablets (2-6 mg total) by mouth daily. 6 tablet 0   No current facility-administered medications on file prior to visit.     No Known Allergies  Past Medical History:  Diagnosis Date  . Allergy   . Diverticulitis   . Hyperlipidemia    Social History   Social History Narrative   Marital status: married x 23 years.      Children: none      Lives: with wife      Employment: Technical sales engineermusician; works with [employer].   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      Tobacco: quit smoking at age 52.      Alcohol:  1-3 beers per week in 2017; has cut down from 2 beers per day.; quit drinking in 07/10/2017.      Drugs:  None      Exercise:  Yoga six days per week.      Seatbelt:  yes   Social History   Tobacco Use  . Smoking status: Former Games developermoker  . Smokeless tobacco: Never Used  Substance Use Topics  . Alcohol use: Yes  . Drug use: No   family history includes COPD in his father; Cancer in his mother;  Diabetes in his mother; Hyperlipidemia in his father; Hypertension in his paternal grandmother; Mental illness in his mother.     Objective:  BP 120/78   Pulse 65   Temp 98.1 F (36.7 C) (Oral)   Resp 18   Ht 5' 10.5" (1.791 m)   Wt 175 lb 3.2 oz (79.5 kg)   SpO2 99%   BMI 24.78 kg/m  Body mass index is 24.78 kg/m.  Physical Exam  Constitutional: He is oriented to person, place, and time and well-developed, well-nourished, and in no distress.  HENT:  Head: Normocephalic and atraumatic.  Right Ear: Hearing, tympanic membrane, external ear and ear canal normal. No foreign bodies.  Left Ear: External ear normal. A foreign body (cerumen - hard and dry - deep) is present.  Ear lavage - cerumen removed - TM visible with some fluid on the canal  Eyes: Conjunctivae are normal.  Neck: Normal range of motion.  Cardiovascular: Normal rate, regular rhythm and normal heart sounds.  No murmur heard. Pulmonary/Chest: Effort normal and breath sounds normal. He has no wheezes.  Neurological: He is alert and oriented to person, place,  and time. Gait normal.  Skin: Skin is warm and dry.  Psychiatric: Mood, memory, affect and judgment normal.    Assessment and Plan :  Impacted cerumen of left ear d/w pt how to prevent this in the future.  Benny Lennert PA-C  Primary Care at [facility] 09/12/2017 9:11 AM

## 2017-09-12 NOTE — Patient Instructions (Signed)
     IF you received an x-ray today, you will receive an invoice from [facility]. Please contact [facility] at [phone] with questions or concerns regarding your invoice.   IF you received labwork today, you will receive an invoice from [facility]. Please contact [facility] at [phone] with questions or concerns regarding your invoice.   Our billing staff will not be able to assist you with questions regarding bills from these companies.  You will be contacted with the lab results as soon as they are available. The fastest way to get your results is to activate your My Chart account. Instructions are located on the last page of this paperwork. If you have not heard from us regarding the results in 2 weeks, please contact this office.     

## 2017-11-30 ENCOUNTER — Encounter: Payer: Self-pay | Admitting: Family Medicine

## 2017-11-30 ENCOUNTER — Other Ambulatory Visit: Payer: Self-pay | Admitting: Family Medicine

## 2017-12-03 IMAGING — DX DG LUMBAR SPINE COMPLETE 4+V
5 series · 5 of 5 positions shown · non-contrast
Comparison: None.

CLINICAL DATA: Lower back pain for 2 weeks without known injury.

EXAM:
LUMBAR SPINE - COMPLETE 4+ VIEW

[l-spine ap]
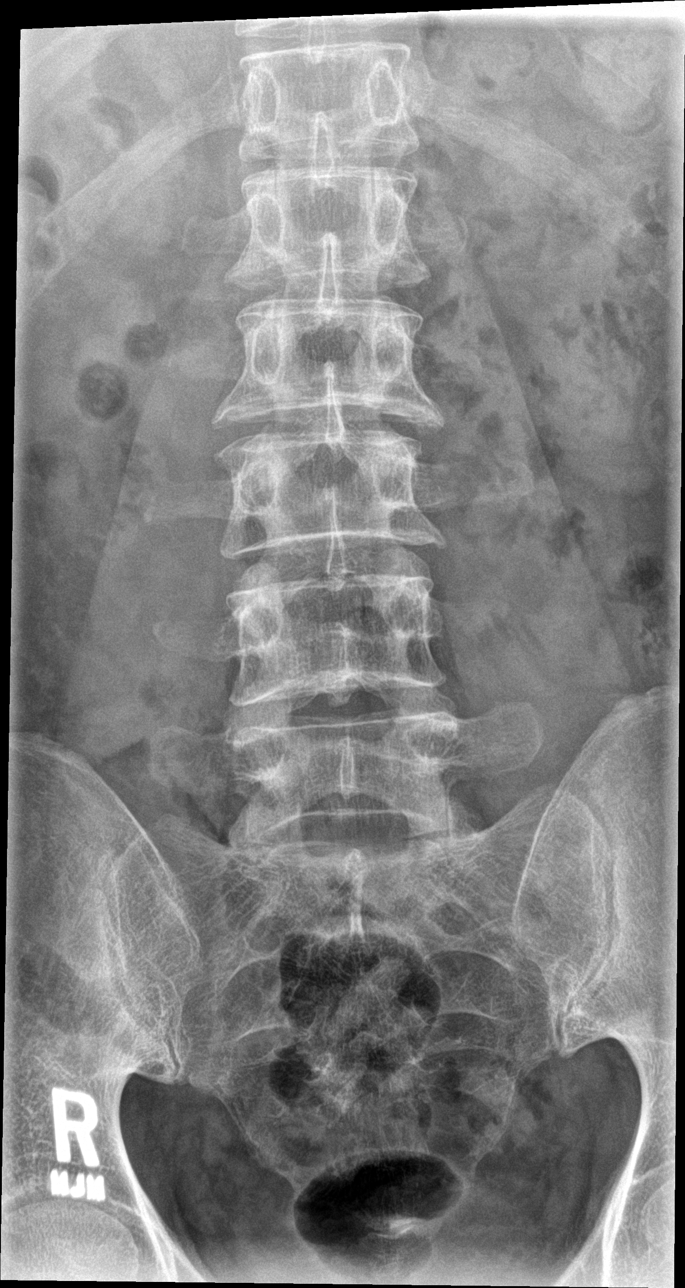

[l-spine obl (1 of 2)]
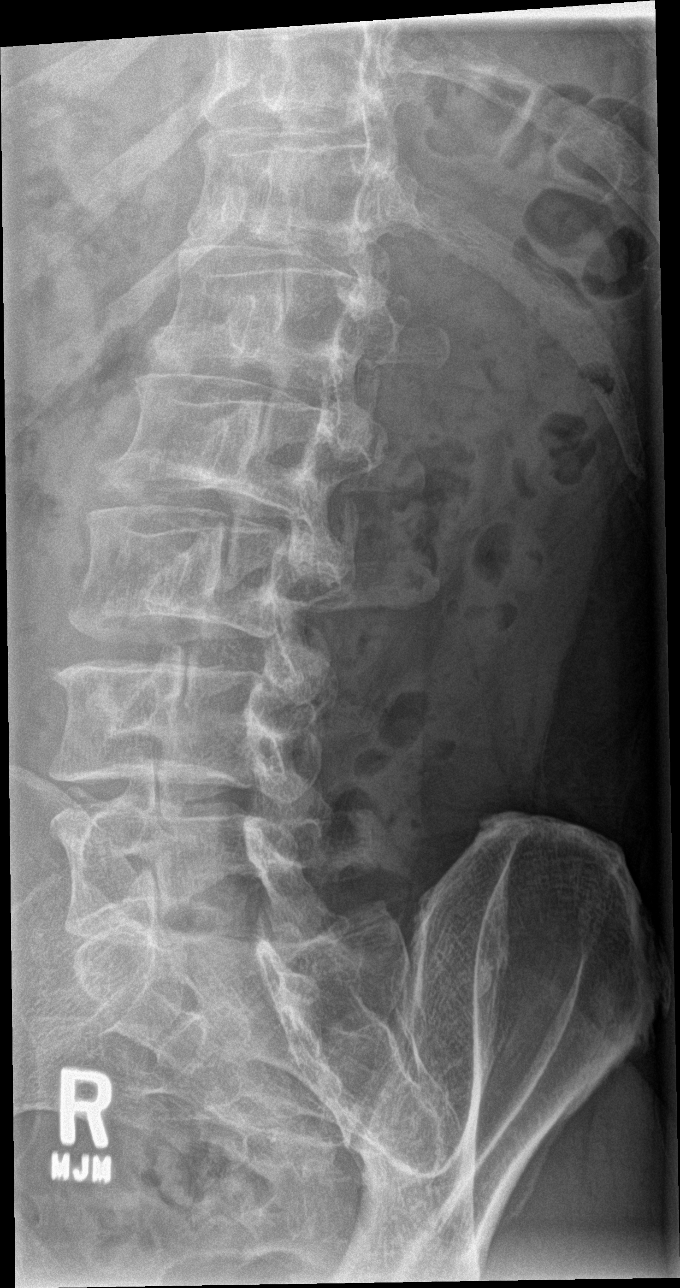

[l-spine obl (2 of 2)]
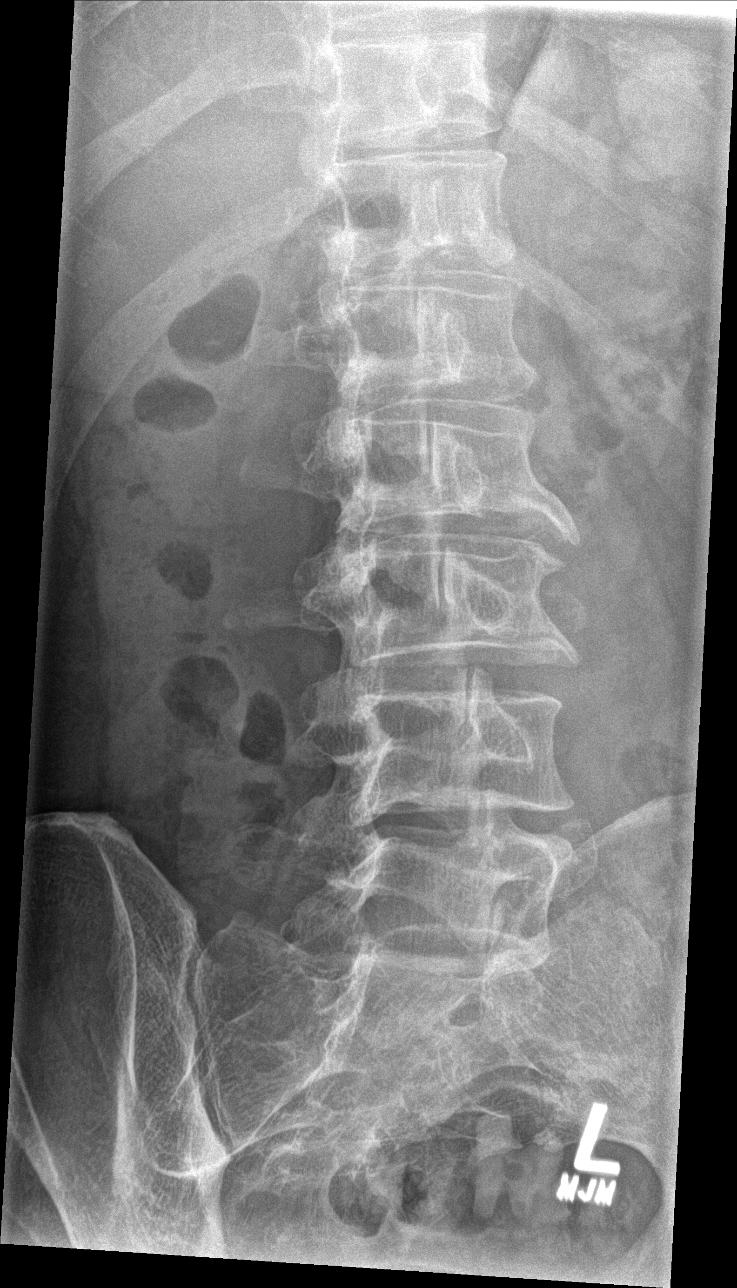

[l-spine lat]
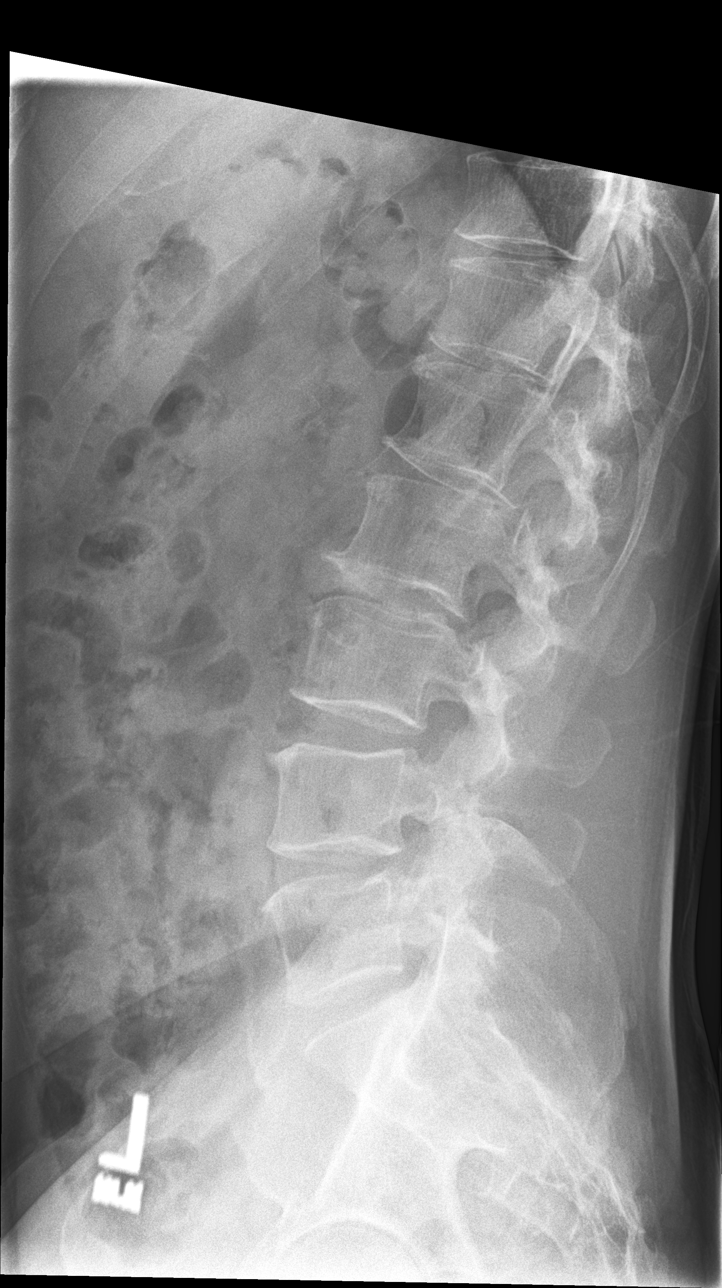

[l-spine l5-s1]
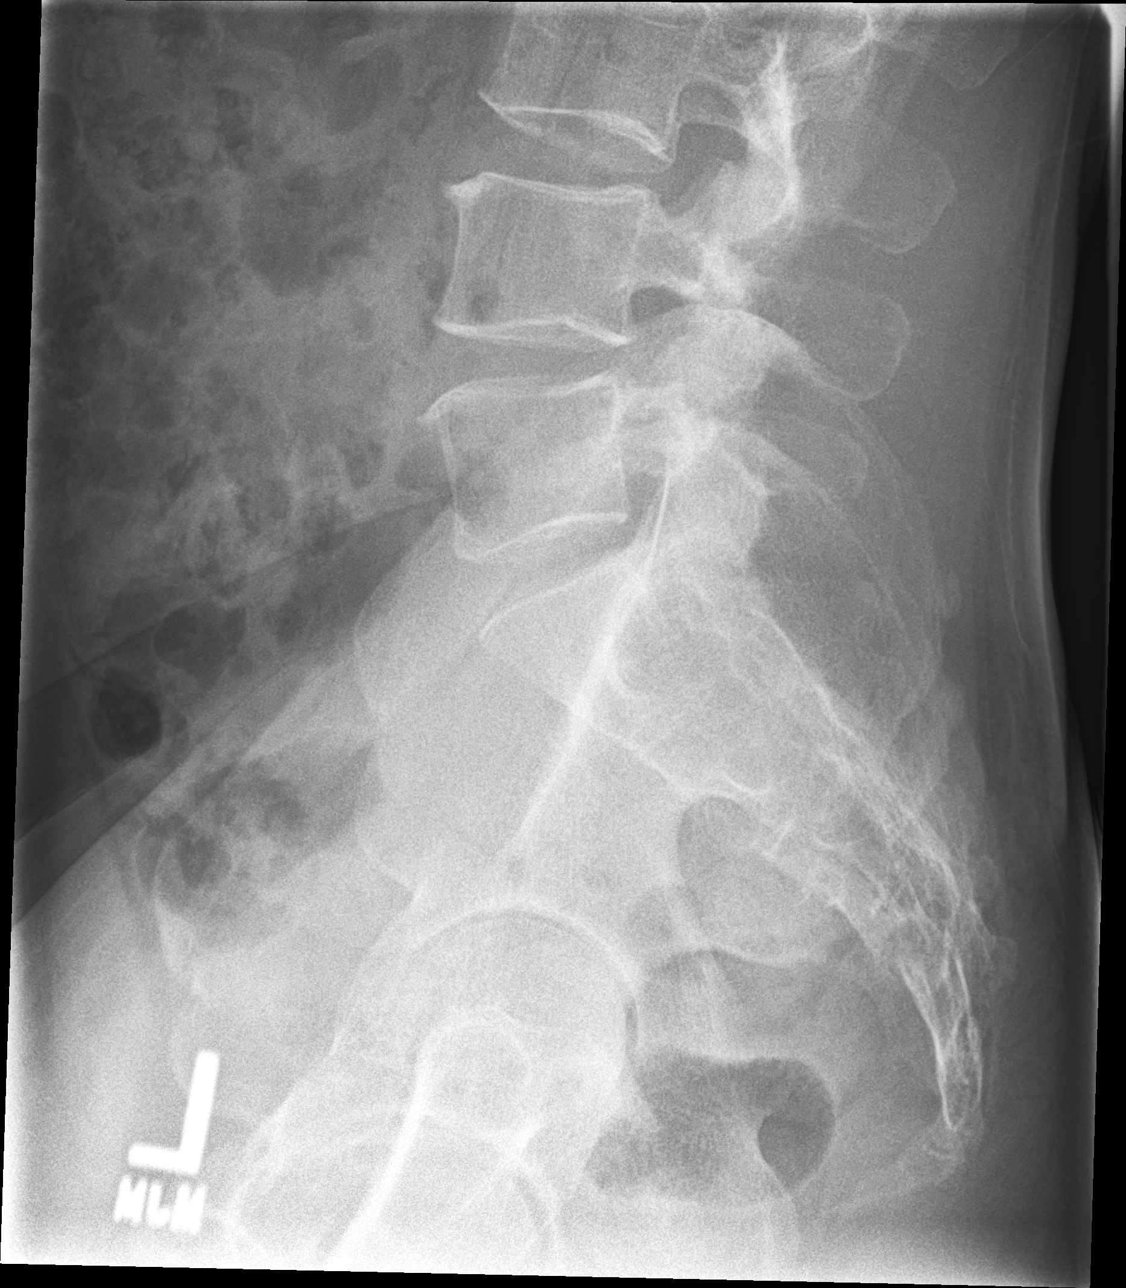

[5 of 5 positions shown; findings below may reference images not displayed]

FINDINGS: No fracture or spondylolisthesis is noted. Mild degenerative disc
disease is noted at L2-3 with anterior osteophyte formation.
Remaining disc spaces appear intact. Posterior facet joints appear
intact.
IMPRESSION: Mild degenerative disc disease is noted at L2-3. No acute
abnormality seen in the lumbar spine.

## 2017-12-05 ENCOUNTER — Encounter: Payer: Self-pay | Admitting: Family Medicine

## 2017-12-05 MED ORDER — DIAZEPAM 2 MG PO TABS: 2.0000 mg | ORAL_TABLET | Freq: Every day | ORAL | 0 refills | Status: AC

## 2018-06-21 ENCOUNTER — Ambulatory Visit: Payer: Self-pay | Admitting: Family Medicine

## 2018-07-18 DIAGNOSIS — Z23 Encounter for immunization: Secondary | ICD-10-CM | POA: Diagnosis not present

## 2018-07-18 DIAGNOSIS — Z125 Encounter for screening for malignant neoplasm of prostate: Secondary | ICD-10-CM | POA: Diagnosis not present

## 2018-07-18 DIAGNOSIS — Z0000 Encounter for general adult medical examination without abnormal findings: Secondary | ICD-10-CM | POA: Diagnosis not present

## 2018-07-18 DIAGNOSIS — F419 Anxiety disorder, unspecified: Secondary | ICD-10-CM | POA: Insufficient documentation

## 2018-07-18 DIAGNOSIS — E782 Mixed hyperlipidemia: Secondary | ICD-10-CM | POA: Diagnosis not present

## 2018-07-18 DIAGNOSIS — Z130 Encounter for screening for diseases of the blood and blood-forming organs and certain disorders involving the immune mechanism: Secondary | ICD-10-CM | POA: Diagnosis not present

## 2018-07-18 DIAGNOSIS — Z13228 Encounter for screening for other metabolic disorders: Secondary | ICD-10-CM | POA: Diagnosis not present

## 2018-07-27 DIAGNOSIS — H524 Presbyopia: Secondary | ICD-10-CM | POA: Diagnosis not present

## 2018-07-27 DIAGNOSIS — H52223 Regular astigmatism, bilateral: Secondary | ICD-10-CM | POA: Diagnosis not present

## 2020-02-19 DIAGNOSIS — Z130 Encounter for screening for diseases of the blood and blood-forming organs and certain disorders involving the immune mechanism: Secondary | ICD-10-CM | POA: Diagnosis not present

## 2020-02-19 DIAGNOSIS — R55 Syncope and collapse: Secondary | ICD-10-CM | POA: Diagnosis not present

## 2020-02-19 DIAGNOSIS — H6122 Impacted cerumen, left ear: Secondary | ICD-10-CM | POA: Diagnosis not present

## 2020-02-19 DIAGNOSIS — Z125 Encounter for screening for malignant neoplasm of prostate: Secondary | ICD-10-CM | POA: Diagnosis not present

## 2020-02-19 DIAGNOSIS — E782 Mixed hyperlipidemia: Secondary | ICD-10-CM | POA: Diagnosis not present

## 2020-02-19 DIAGNOSIS — Z1211 Encounter for screening for malignant neoplasm of colon: Secondary | ICD-10-CM | POA: Diagnosis not present

## 2020-02-19 DIAGNOSIS — Z0000 Encounter for general adult medical examination without abnormal findings: Secondary | ICD-10-CM | POA: Diagnosis not present

## 2020-02-19 DIAGNOSIS — F40243 Fear of flying: Secondary | ICD-10-CM | POA: Diagnosis not present

## 2020-02-19 DIAGNOSIS — Z13228 Encounter for screening for other metabolic disorders: Secondary | ICD-10-CM | POA: Diagnosis not present

## 2020-02-19 DIAGNOSIS — R7309 Other abnormal glucose: Secondary | ICD-10-CM | POA: Diagnosis not present

## 2020-05-01 DIAGNOSIS — K635 Polyp of colon: Secondary | ICD-10-CM | POA: Diagnosis not present

## 2020-05-01 DIAGNOSIS — D122 Benign neoplasm of ascending colon: Secondary | ICD-10-CM | POA: Diagnosis not present

## 2020-05-01 DIAGNOSIS — K5730 Diverticulosis of large intestine without perforation or abscess without bleeding: Secondary | ICD-10-CM | POA: Diagnosis not present

## 2020-05-01 DIAGNOSIS — Z1211 Encounter for screening for malignant neoplasm of colon: Secondary | ICD-10-CM | POA: Diagnosis not present

## 2020-05-01 DIAGNOSIS — K640 First degree hemorrhoids: Secondary | ICD-10-CM | POA: Diagnosis not present

## 2021-03-15 DIAGNOSIS — M7130 Other bursal cyst, unspecified site: Secondary | ICD-10-CM | POA: Insufficient documentation

## 2021-11-03 DIAGNOSIS — L814 Other melanin hyperpigmentation: Secondary | ICD-10-CM | POA: Insufficient documentation

## 2023-01-20 DIAGNOSIS — M67441 Ganglion, right hand: Secondary | ICD-10-CM | POA: Insufficient documentation

## 2024-07-23 ENCOUNTER — Encounter: Payer: Self-pay | Admitting: Family Medicine

## 2024-07-23 ENCOUNTER — Ambulatory Visit: Admitting: Family Medicine

## 2024-07-23 VITALS — BP 128/82 | HR 63 | Temp 98.3°F | Ht 70.5 in | Wt 177.8 lb

## 2024-07-23 DIAGNOSIS — Z0000 Encounter for general adult medical examination without abnormal findings: Secondary | ICD-10-CM

## 2024-07-23 DIAGNOSIS — E782 Mixed hyperlipidemia: Secondary | ICD-10-CM | POA: Diagnosis not present

## 2024-07-23 DIAGNOSIS — Z0001 Encounter for general adult medical examination with abnormal findings: Secondary | ICD-10-CM

## 2024-07-23 DIAGNOSIS — Z9852 Vasectomy status: Secondary | ICD-10-CM | POA: Diagnosis not present

## 2024-07-23 DIAGNOSIS — Z125 Encounter for screening for malignant neoplasm of prostate: Secondary | ICD-10-CM

## 2024-07-23 NOTE — Progress Notes (Signed)
 "  Subjective:    Patient ID: Kenneth Webb, male    DOB: 1966/01/28, 59 y.o.   MRN: [id number]  HPI Patient is a very pleasant 59 year old Caucasian gentleman here today to establish care.  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  He denies any medical concerns.  He does have a past medical history of diverticulitis in the remote past.  Otherwise he has been extremely healthy.  His mother passed away from breast cancer but also had a history of bipolar disorder.  There was also a history of bipolar disorder in his paternal grandfather.  His paternal grandfather committed suicide however the situation was complicated by an infection and a carotid endarterectomy surgery.  Father has a history of COPD and hyperlipidemia from smoking.  Father and paternal grandfather both had carotid artery disease.  He also has a remote history of prostate cancer in his family.  Patient has had colonoscopies 3 times in the past.  1 time he had an extremely large tubular adenoma so they repeated the colonoscopy after 1 year.  The most recent colonoscopy, the patient received a 5-year recommendation due to his history of tubular adenomas.  He is due for prostate cancer screening.  Reviewing his shot record, the patient is due for a flu shot, he has had the shingles vaccine, he is now due for a pneumonia vaccine due to being over the age of 89.  We discussed COVID and RSV and given how healthy the patient is, I do not feel this is medically necessary for him.  He is due for fasting lab work. Past Medical History:  Diagnosis Date   Allergy    Diverticulitis    Hyperlipidemia    Past Surgical History:  Procedure Laterality Date   VASECTOMY     Medications Ordered Prior to Encounter[1] Allergies[2] Social History   Socioeconomic History   Marital status: Married    Spouse name: Not on file   Number of children: Not on file   Years of education: Not on file   Highest education level: Master's degree (e.g., MA,  MS, MEng, MEd, MSW, MBA)  Occupational History   Occupation: runner, broadcasting/film/video   Tobacco Use   Smoking status: Former   Smokeless tobacco: Never  Substance and Sexual Activity   Alcohol use: Yes   Drug use: No   Sexual activity: Yes  Other Topics Concern   Not on file  Social History Narrative   Marital status: married x 23 years.      Children: none      Lives: with wife      Employment: technical sales engineer; works with [employer].   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      Tobacco: quit smoking at age 50.      Alcohol:  1-3 beers per week in 2017; has cut down from 2 beers per day.; quit drinking in 07/10/2017.      Drugs:  None      Exercise:  Yoga six days per week.      Seatbelt:  yes   Social Drivers of Health   Tobacco Use: Medium Risk (07/23/2024)   Patient History    Smoking Tobacco Use: Former    Smokeless Tobacco Use: Never    Passive Exposure: Not on file  Financial Resource Strain: Low Risk (07/20/2024)   Overall Financial Resource Strain (CARDIA)    Difficulty of Paying Living Expenses: Not hard at all  Food  Insecurity: No Food Insecurity (07/20/2024)   Epic    Worried About Programme Researcher, Broadcasting/film/video in the Last Year: Never true    Ran Out of Food in the Last Year: Never true  Transportation Needs: No Transportation Needs (07/20/2024)   Epic    Lack of Transportation (Medical): No    Lack of Transportation (Non-Medical): No  Physical Activity: Sufficiently Active (07/20/2024)   Exercise Vital Sign    Days of Exercise per Week: 5 days    Minutes of Exercise per Session: 70 min  Stress: No Stress Concern Present (07/20/2024)   [employer] of Occupational Health - Occupational Stress Questionnaire    Feeling of Stress: Not at all  Social Connections: Moderately Isolated (07/20/2024)   Social Connection and Isolation Panel    Frequency of Communication with Friends and Family: Twice a week    Frequency of Social Gatherings  with Friends and Family: Once a week    Attends Religious Services: Never    Database Administrator or Organizations: No    Attends Engineer, Structural: Not on file    Marital Status: Married  Catering Manager Violence: Not At Risk (10/08/2023)   Received from [facility]   HITS    Over the last 12 months how often did your partner physically hurt you?: Never    Over the last 12 months how often did your partner insult you or talk down to you?: Never    Over the last 12 months how often did your partner threaten you with physical harm?: Never    Over the last 12 months how often did your partner scream or curse at you?: Never  Depression (PHQ2-9): Low Risk (07/23/2024)   Depression (PHQ2-9)    PHQ-2 Score: 0  Alcohol Screen: Low Risk (07/20/2024)   Alcohol Screen    Last Alcohol Screening Score (AUDIT): 4  Housing: Low Risk (07/20/2024)   Epic    Unable to Pay for Housing in the Last Year: No    Number of Times Moved in the Last Year: 0    Homeless in the Last Year: No  Utilities: Not At Risk (10/08/2023)   Received from [facility] Utilities    Threatened with loss of utilities: No  Health Literacy: Not on file   Family History  Problem Relation Age of Onset   Cancer Mother        breast cancer   Mental illness Mother        bipolar disorder   Diabetes Mother    COPD Father    Hyperlipidemia Father    Hypertension Paternal Grandmother       Review of Systems  All other systems reviewed and are negative.      Objective:   Physical Exam Constitutional:      General: He is not in acute distress.    Appearance: Normal appearance. He is normal weight. He is not ill-appearing, toxic-appearing or diaphoretic.  HENT:     Head: Normocephalic and atraumatic.     Right Ear: Tympanic membrane and ear canal normal. There is no impacted cerumen.     Left Ear: Tympanic membrane and ear canal normal. There is no impacted cerumen.     Nose: No congestion or  rhinorrhea.     Mouth/Throat:     Mouth: Mucous membranes are moist.     Pharynx: Oropharynx is clear. No oropharyngeal exudate or posterior oropharyngeal erythema.  Eyes:     General:  No scleral icterus.       Right eye: No discharge.        Left eye: No discharge.     Conjunctiva/sclera: Conjunctivae normal.     Pupils: Pupils are equal, round, and reactive to light.  Neck:     Vascular: No carotid bruit.  Cardiovascular:     Rate and Rhythm: Normal rate and regular rhythm.     Pulses: Normal pulses.     Heart sounds: Normal heart sounds. No murmur heard.    No friction rub. No gallop.  Pulmonary:     Effort: Pulmonary effort is normal. No respiratory distress.     Breath sounds: Normal breath sounds. No wheezing, rhonchi or rales.  Chest:     Chest wall: No tenderness.  Abdominal:     General: Bowel sounds are normal. There is no distension.     Palpations: Abdomen is soft.     Tenderness: There is no abdominal tenderness. There is no guarding or rebound.  Musculoskeletal:        General: No swelling, tenderness, deformity or signs of injury.     Cervical back: Normal range of motion and neck supple.     Right lower leg: No edema.     Left lower leg: No edema.  Lymphadenopathy:     Cervical: No cervical adenopathy.  Skin:    General: Skin is warm.     Findings: Lesion present. No bruising, erythema or rash.  Neurological:     General: No focal deficit present.     Mental Status: He is alert and oriented to person, place, and time. Mental status is at baseline.     Cranial Nerves: No cranial nerve deficit.     Sensory: No sensory deficit.     Motor: No weakness.     Coordination: Coordination normal.     Gait: Gait normal.     Deep Tendon Reflexes: Reflexes normal.  Psychiatric:        Mood and Affect: Mood normal.        Behavior: Behavior normal.        Thought Content: Thought content normal.        Judgment: Judgment normal.       Patient has a small  sebaceous cyst in the center of his back    Assessment & Plan:   General medical exam - Plan: CBC with Differential/Platelet, Comprehensive metabolic panel with GFR, Lipid panel, PSA  H/O vasectomy  Moderate mixed hyperlipidemia not requiring statin therapy  Prostate cancer screening  Physical exam today is completely normal.  Recommended a flu shot.  Recommended the pneumonia vaccine.  The patient states he will return at a later date for this.  We discussed hepatitis C screening and hepatitis B vaccination.  He declines this today.  Colonoscopy is up-to-date.  The patient is due for prostate cancer screening.  Patient will return and I will get a PSA.  He has a history of hyperlipidemia although he states that he is able to control this with diet.  I would like to check a fasting lipid panel along with a CMP.  Given his father and his grandfather's history of carotid artery disease, I would also like to check a coronary artery calcium score to help risk stratify the patient to see if we need to more aggressively treat his cholesterol.  Patient is comfortable with this test.    [1]  Current Outpatient Medications on File Prior to Visit  Medication Sig  Dispense Refill   diazepam  (VALIUM ) 2 MG tablet Take 1-3 tablets (2-6 mg total) by mouth daily. (Patient taking differently: Take 2-6 mg by mouth daily. Use for flight anxiety.) 18 tablet 0   No current facility-administered medications on file prior to visit.  [2]  Allergies Allergen Reactions   Nitrous Oxide Nausea And Vomiting   "

## 2024-07-29 ENCOUNTER — Encounter: Payer: Self-pay | Admitting: Family Medicine

## 2024-07-30 ENCOUNTER — Other Ambulatory Visit

## 2024-07-30 ENCOUNTER — Ambulatory Visit: Admitting: Family Medicine

## 2024-07-30 DIAGNOSIS — Z23 Encounter for immunization: Secondary | ICD-10-CM

## 2024-07-30 NOTE — Progress Notes (Signed)
 Patient is in office today for a nurse visit for Fluzone and PCV 20 Immunization. Patient Injection was given in the Pcv 20 in the Left deltoid and the flu zone in the Right Deltoid . Patient tolerated injection well.

## 2024-07-31 ENCOUNTER — Other Ambulatory Visit

## 2024-07-31 LAB — CBC WITH DIFFERENTIAL/PLATELET
Absolute Lymphocytes: 1443 {cells}/uL (ref 850–3900)
Absolute Monocytes: 625 {cells}/uL (ref 200–950)
Basophils Absolute: 53 {cells}/uL (ref 0–200)
Basophils Relative: 0.6 %
Eosinophils Absolute: 264 {cells}/uL (ref 15–500)
Eosinophils Relative: 3 %
HCT: 44.7 % (ref 39.4–51.1)
Hemoglobin: 14.9 g/dL (ref 13.2–17.1)
MCH: 31.4 pg (ref 27.0–33.0)
MCHC: 33.3 g/dL (ref 31.6–35.4)
MCV: 94.3 fL (ref 81.4–101.7)
MPV: 9.2 fL (ref 7.5–12.5)
Monocytes Relative: 7.1 %
Neutro Abs: 6415 {cells}/uL (ref 1500–7800)
Neutrophils Relative %: 72.9 %
Platelets: 303 Thousand/uL (ref 140–400)
RBC: 4.74 Million/uL (ref 4.20–5.80)
RDW: 12.2 % (ref 11.0–15.0)
Total Lymphocyte: 16.4 %
WBC: 8.8 Thousand/uL (ref 3.8–10.8)

## 2024-07-31 LAB — COMPREHENSIVE METABOLIC PANEL WITH GFR
AG Ratio: 1.9 (calc) (ref 1.0–2.5)
ALT: 31 U/L (ref 9–46)
AST: 17 U/L (ref 10–35)
Albumin: 4.3 g/dL (ref 3.6–5.1)
Alkaline phosphatase (APISO): 61 U/L (ref 35–144)
BUN: 18 mg/dL (ref 7–25)
CO2: 27 mmol/L (ref 20–32)
Calcium: 9 mg/dL (ref 8.6–10.3)
Chloride: 107 mmol/L (ref 98–110)
Creat: 0.87 mg/dL (ref 0.70–1.30)
Globulin: 2.3 g/dL (ref 1.9–3.7)
Glucose, Bld: 101 mg/dL — ABNORMAL HIGH (ref 65–99)
Potassium: 4.1 mmol/L (ref 3.5–5.3)
Sodium: 142 mmol/L (ref 135–146)
Total Bilirubin: 1 mg/dL (ref 0.2–1.2)
Total Protein: 6.6 g/dL (ref 6.1–8.1)
eGFR: 101 mL/min/1.73m2

## 2024-07-31 LAB — LIPID PANEL
Cholesterol: 194 mg/dL
HDL: 67 mg/dL
LDL Cholesterol (Calc): 110 mg/dL — ABNORMAL HIGH
Non-HDL Cholesterol (Calc): 127 mg/dL
Total CHOL/HDL Ratio: 2.9 (calc)
Triglycerides: 84 mg/dL

## 2024-07-31 LAB — PSA: PSA: 2.05 ng/mL

## 2024-08-01 ENCOUNTER — Ambulatory Visit: Payer: Self-pay | Admitting: Family Medicine

## 2024-08-01 ENCOUNTER — Ambulatory Visit: Admitting: Family Medicine

## 2024-08-07 ENCOUNTER — Ambulatory Visit (HOSPITAL_COMMUNITY): Payer: Self-pay

## 2024-08-15 ENCOUNTER — Ambulatory Visit: Admitting: Family Medicine

## 2024-08-20 ENCOUNTER — Other Ambulatory Visit (HOSPITAL_COMMUNITY): Payer: Self-pay

## 2024-09-12 ENCOUNTER — Ambulatory Visit: Admitting: Family Medicine
